# Patient Record
Sex: Female | Born: 1935 | Race: White | Hispanic: No | Smoking: Former smoker
Health system: Southern US, Community
[De-identification: ages and names within clinical notes are randomized; demographics above are authoritative.]

## PROBLEM LIST (undated history)

## (undated) DIAGNOSIS — K5909 Other constipation: Secondary | ICD-10-CM

## (undated) DIAGNOSIS — E785 Hyperlipidemia, unspecified: Secondary | ICD-10-CM

## (undated) DIAGNOSIS — G459 Transient cerebral ischemic attack, unspecified: Secondary | ICD-10-CM

## (undated) DIAGNOSIS — I420 Dilated cardiomyopathy: Secondary | ICD-10-CM

## (undated) DIAGNOSIS — Z9289 Personal history of other medical treatment: Secondary | ICD-10-CM

## (undated) DIAGNOSIS — N19 Unspecified kidney failure: Secondary | ICD-10-CM

## (undated) DIAGNOSIS — S0512XA Contusion of eyeball and orbital tissues, left eye, initial encounter: Secondary | ICD-10-CM

## (undated) DIAGNOSIS — I10 Essential (primary) hypertension: Secondary | ICD-10-CM

## (undated) DIAGNOSIS — I129 Hypertensive chronic kidney disease with stage 1 through stage 4 chronic kidney disease, or unspecified chronic kidney disease: Secondary | ICD-10-CM

## (undated) DIAGNOSIS — I440 Atrioventricular block, first degree: Secondary | ICD-10-CM

## (undated) DIAGNOSIS — N189 Chronic kidney disease, unspecified: Secondary | ICD-10-CM

## (undated) DIAGNOSIS — K625 Hemorrhage of anus and rectum: Secondary | ICD-10-CM

## (undated) DIAGNOSIS — I493 Ventricular premature depolarization: Secondary | ICD-10-CM

## (undated) DIAGNOSIS — M1990 Unspecified osteoarthritis, unspecified site: Secondary | ICD-10-CM

## (undated) DIAGNOSIS — T8859XA Other complications of anesthesia, initial encounter: Secondary | ICD-10-CM

## (undated) DIAGNOSIS — R29898 Other symptoms and signs involving the musculoskeletal system: Secondary | ICD-10-CM

## (undated) DIAGNOSIS — M169 Osteoarthritis of hip, unspecified: Secondary | ICD-10-CM

## (undated) DIAGNOSIS — T4145XA Adverse effect of unspecified anesthetic, initial encounter: Secondary | ICD-10-CM

## (undated) DIAGNOSIS — G629 Polyneuropathy, unspecified: Secondary | ICD-10-CM

## (undated) DIAGNOSIS — I447 Left bundle-branch block, unspecified: Secondary | ICD-10-CM

## (undated) DIAGNOSIS — R55 Syncope and collapse: Secondary | ICD-10-CM

## (undated) HISTORY — DX: Personal history of other medical treatment: Z92.89

## (undated) HISTORY — DX: Unspecified osteoarthritis, unspecified site: M19.90

## (undated) HISTORY — PX: ABDOMINAL HYSTERECTOMY: SHX81

## (undated) HISTORY — DX: Hemorrhage of anus and rectum: K62.5

## (undated) HISTORY — DX: Dilated cardiomyopathy: I42.0

## (undated) HISTORY — DX: Hyperlipidemia, unspecified: E78.5

## (undated) HISTORY — DX: Ventricular premature depolarization: I49.3

## (undated) HISTORY — PX: KNEE SURGERY: SHX244

## (undated) HISTORY — DX: Atrioventricular block, first degree: I44.0

## (undated) HISTORY — DX: Syncope and collapse: R55

## (undated) HISTORY — DX: Osteoarthritis of hip, unspecified: M16.9

## (undated) HISTORY — PX: BLADDER SURGERY: SHX569

## (undated) HISTORY — DX: Other symptoms and signs involving the musculoskeletal system: R29.898

## (undated) HISTORY — PX: DILATION AND CURETTAGE OF UTERUS: SHX78

## (undated) HISTORY — DX: Contusion of eyeball and orbital tissues, left eye, initial encounter: S05.12XA

## (undated) HISTORY — DX: Hypertensive chronic kidney disease with stage 1 through stage 4 chronic kidney disease, or unspecified chronic kidney disease: I12.9

## (undated) HISTORY — DX: Polyneuropathy, unspecified: G62.9

## (undated) HISTORY — PX: BACK SURGERY: SHX140

## (undated) HISTORY — PX: TONSILLECTOMY: SUR1361

---

## 1998-02-05 ENCOUNTER — Encounter (HOSPITAL_COMMUNITY): Admission: RE | Admit: 1998-02-05 | Discharge: 1998-05-06 | Payer: Self-pay | Admitting: Family Medicine

## 1998-04-15 ENCOUNTER — Other Ambulatory Visit: Admission: RE | Admit: 1998-04-15 | Discharge: 1998-04-15 | Payer: Self-pay | Admitting: *Deleted

## 1998-05-20 ENCOUNTER — Encounter (HOSPITAL_COMMUNITY): Admission: RE | Admit: 1998-05-20 | Discharge: 1998-08-04 | Payer: Self-pay | Admitting: Psychiatry

## 1998-08-04 ENCOUNTER — Encounter (HOSPITAL_COMMUNITY): Admission: RE | Admit: 1998-08-04 | Discharge: 1998-11-02 | Payer: Self-pay | Admitting: Psychiatry

## 1999-03-08 ENCOUNTER — Emergency Department (HOSPITAL_COMMUNITY): Admission: EM | Admit: 1999-03-08 | Discharge: 1999-03-08 | Payer: Self-pay | Admitting: Emergency Medicine

## 1999-09-20 ENCOUNTER — Other Ambulatory Visit: Admission: RE | Admit: 1999-09-20 | Discharge: 1999-09-20 | Payer: Self-pay | Admitting: *Deleted

## 1999-12-12 ENCOUNTER — Encounter: Admission: RE | Admit: 1999-12-12 | Discharge: 2000-03-11 | Payer: Self-pay | Admitting: Anesthesiology

## 2000-03-19 ENCOUNTER — Encounter: Admission: RE | Admit: 2000-03-19 | Discharge: 2000-06-17 | Payer: Self-pay | Admitting: Anesthesiology

## 2000-10-19 ENCOUNTER — Other Ambulatory Visit: Admission: RE | Admit: 2000-10-19 | Discharge: 2000-10-19 | Payer: Self-pay | Admitting: *Deleted

## 2001-03-24 ENCOUNTER — Inpatient Hospital Stay (HOSPITAL_COMMUNITY): Admission: EM | Admit: 2001-03-24 | Discharge: 2001-03-26 | Payer: Self-pay | Admitting: Emergency Medicine

## 2001-03-26 DIAGNOSIS — R55 Syncope and collapse: Secondary | ICD-10-CM

## 2001-03-26 HISTORY — DX: Syncope and collapse: R55

## 2002-02-17 ENCOUNTER — Emergency Department (HOSPITAL_COMMUNITY): Admission: EM | Admit: 2002-02-17 | Discharge: 2002-02-17 | Payer: Self-pay | Admitting: Emergency Medicine

## 2002-02-17 ENCOUNTER — Encounter: Payer: Self-pay | Admitting: Emergency Medicine

## 2002-05-29 ENCOUNTER — Encounter: Payer: Self-pay | Admitting: Orthopedic Surgery

## 2002-06-05 ENCOUNTER — Observation Stay (HOSPITAL_COMMUNITY): Admission: RE | Admit: 2002-06-05 | Discharge: 2002-06-06 | Payer: Self-pay | Admitting: Orthopedic Surgery

## 2002-06-05 HISTORY — PX: SHOULDER SURGERY: SHX246

## 2003-02-17 ENCOUNTER — Ambulatory Visit (HOSPITAL_COMMUNITY): Admission: RE | Admit: 2003-02-17 | Discharge: 2003-02-17 | Payer: Self-pay | Admitting: *Deleted

## 2003-02-17 DIAGNOSIS — K625 Hemorrhage of anus and rectum: Secondary | ICD-10-CM

## 2003-02-17 HISTORY — DX: Hemorrhage of anus and rectum: K62.5

## 2003-03-10 ENCOUNTER — Encounter: Admission: RE | Admit: 2003-03-10 | Discharge: 2003-03-10 | Payer: Self-pay | Admitting: Internal Medicine

## 2003-06-02 ENCOUNTER — Emergency Department (HOSPITAL_COMMUNITY): Admission: EM | Admit: 2003-06-02 | Discharge: 2003-06-03 | Payer: Self-pay | Admitting: Emergency Medicine

## 2003-06-02 ENCOUNTER — Encounter: Payer: Self-pay | Admitting: Emergency Medicine

## 2003-06-28 ENCOUNTER — Ambulatory Visit (HOSPITAL_COMMUNITY): Admission: RE | Admit: 2003-06-28 | Discharge: 2003-06-28 | Payer: Self-pay | Admitting: Neurology

## 2004-12-05 ENCOUNTER — Encounter: Admission: RE | Admit: 2004-12-05 | Discharge: 2004-12-05 | Payer: Self-pay | Admitting: Internal Medicine

## 2005-06-13 ENCOUNTER — Encounter: Admission: RE | Admit: 2005-06-13 | Discharge: 2005-06-13 | Payer: Self-pay | Admitting: Internal Medicine

## 2006-08-05 ENCOUNTER — Emergency Department (HOSPITAL_COMMUNITY): Admission: EM | Admit: 2006-08-05 | Discharge: 2006-08-05 | Payer: Self-pay | Admitting: Emergency Medicine

## 2007-03-20 ENCOUNTER — Encounter: Admission: RE | Admit: 2007-03-20 | Discharge: 2007-03-20 | Payer: Self-pay | Admitting: Internal Medicine

## 2007-10-30 ENCOUNTER — Encounter: Admission: RE | Admit: 2007-10-30 | Discharge: 2007-10-30 | Payer: Self-pay | Admitting: Internal Medicine

## 2009-03-27 ENCOUNTER — Encounter: Admission: RE | Admit: 2009-03-27 | Discharge: 2009-03-27 | Payer: Self-pay | Admitting: Otolaryngology

## 2009-06-23 ENCOUNTER — Inpatient Hospital Stay (HOSPITAL_COMMUNITY): Admission: RE | Admit: 2009-06-23 | Discharge: 2009-06-26 | Payer: Self-pay | Admitting: Specialist

## 2009-08-02 ENCOUNTER — Inpatient Hospital Stay (HOSPITAL_COMMUNITY): Admission: EM | Admit: 2009-08-02 | Discharge: 2009-08-04 | Payer: Self-pay | Admitting: Emergency Medicine

## 2010-03-16 ENCOUNTER — Encounter: Admission: RE | Admit: 2010-03-16 | Discharge: 2010-03-16 | Payer: Self-pay | Admitting: Neurology

## 2010-05-04 ENCOUNTER — Encounter: Admission: RE | Admit: 2010-05-04 | Discharge: 2010-05-04 | Payer: Self-pay | Admitting: Internal Medicine

## 2010-05-15 ENCOUNTER — Emergency Department (HOSPITAL_COMMUNITY): Admission: EM | Admit: 2010-05-15 | Discharge: 2010-05-15 | Payer: Self-pay | Admitting: Emergency Medicine

## 2010-09-18 ENCOUNTER — Encounter: Payer: Self-pay | Admitting: Orthopedic Surgery

## 2010-09-18 ENCOUNTER — Encounter: Payer: Self-pay | Admitting: Specialist

## 2010-11-10 LAB — DIFFERENTIAL
Basophils Absolute: 0.1 10*3/uL (ref 0.0–0.1)
Basophils Relative: 1 % (ref 0–1)
Eosinophils Absolute: 0.3 10*3/uL (ref 0.0–0.7)
Eosinophils Relative: 4 % (ref 0–5)
Lymphocytes Relative: 23 % (ref 12–46)
Lymphs Abs: 1.5 10*3/uL (ref 0.7–4.0)
Monocytes Absolute: 0.5 10*3/uL (ref 0.1–1.0)
Monocytes Relative: 7 % (ref 3–12)
Neutro Abs: 4.3 10*3/uL (ref 1.7–7.7)
Neutrophils Relative %: 65 % (ref 43–77)

## 2010-11-10 LAB — CBC
HCT: 38.6 % (ref 36.0–46.0)
Hemoglobin: 13.1 g/dL (ref 12.0–15.0)
MCH: 31.6 pg (ref 26.0–34.0)
MCHC: 33.9 g/dL (ref 30.0–36.0)
MCV: 93 fL (ref 78.0–100.0)
Platelets: 250 10*3/uL (ref 150–400)
RBC: 4.15 MIL/uL (ref 3.87–5.11)
RDW: 13.6 % (ref 11.5–15.5)
WBC: 6.6 10*3/uL (ref 4.0–10.5)

## 2010-11-10 LAB — BASIC METABOLIC PANEL
BUN: 24 mg/dL — ABNORMAL HIGH (ref 6–23)
CO2: 25 mEq/L (ref 19–32)
Calcium: 10.1 mg/dL (ref 8.4–10.5)
Chloride: 107 mEq/L (ref 96–112)
Creatinine, Ser: 1.56 mg/dL — ABNORMAL HIGH (ref 0.4–1.2)
GFR calc Af Amer: 38 mL/min — ABNORMAL LOW (ref >60)
GFR calc non Af Amer: 32 mL/min — ABNORMAL LOW (ref >60)
Glucose, Bld: 94 mg/dL (ref 70–99)
Potassium: 4.4 mEq/L (ref 3.5–5.1)
Sodium: 140 mEq/L (ref 135–145)

## 2010-11-10 LAB — URINE MICROSCOPIC-ADD ON

## 2010-11-10 LAB — URINALYSIS, ROUTINE W REFLEX MICROSCOPIC
Glucose, UA: NEGATIVE mg/dL
Nitrite: NEGATIVE
Specific Gravity, Urine: 1.009 (ref 1.005–1.030)
pH: 5.5 (ref 5.0–8.0)

## 2010-11-10 LAB — ETHANOL: Alcohol, Ethyl (B): <5 mg/dL (ref 0–10)

## 2010-11-10 LAB — BRAIN NATRIURETIC PEPTIDE: Pro B Natriuretic peptide (BNP): 118 pg/mL — ABNORMAL HIGH (ref 0.0–100.0)

## 2010-11-14 IMAGING — CR DG CHEST 2V
2 series · 2 of 2 positions shown · non-contrast
Comparison: 08/05/2006

CLINICAL DATA: Preop stenosis L5-S1.

CHEST - 2 VIEW

[w chest pa]
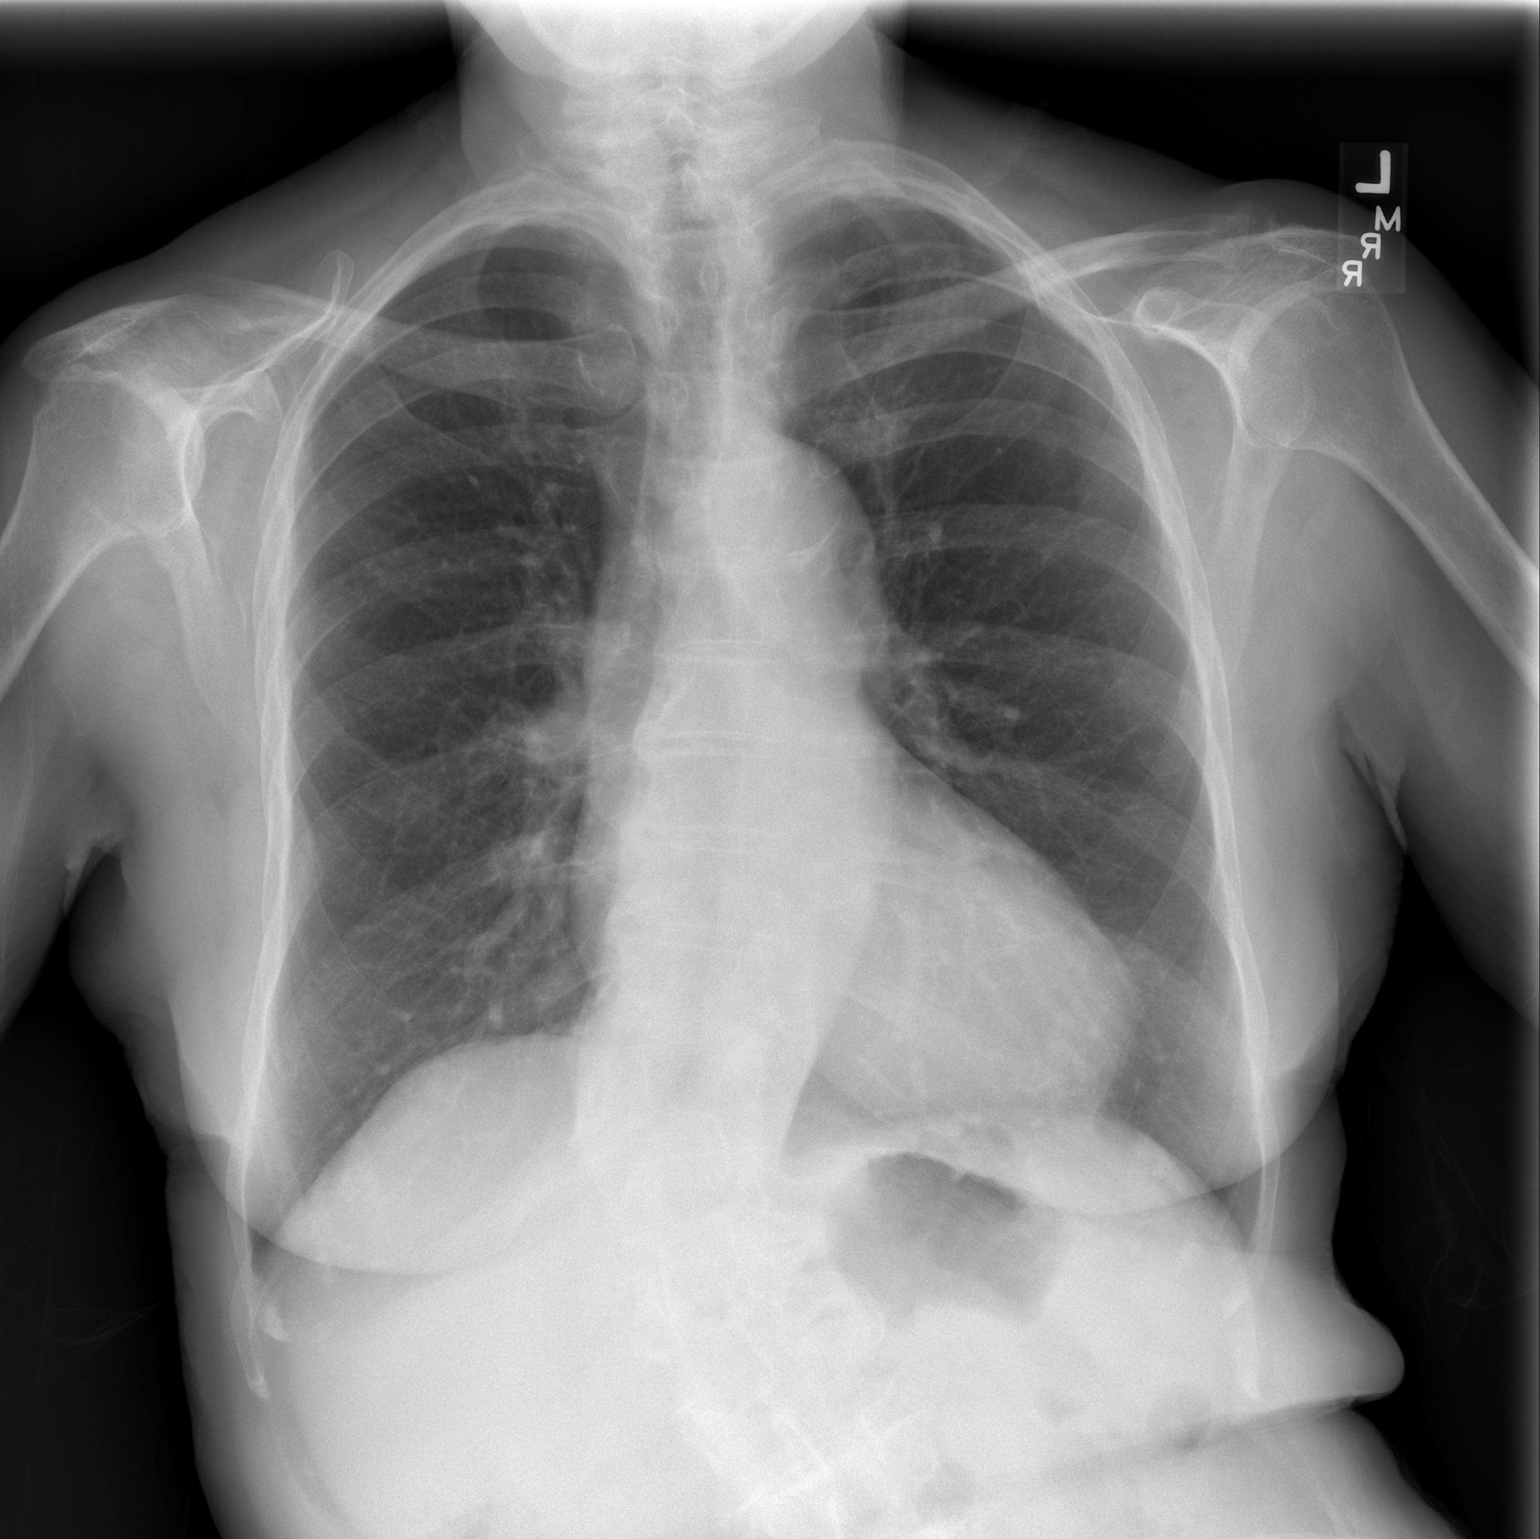

[w chest lat]
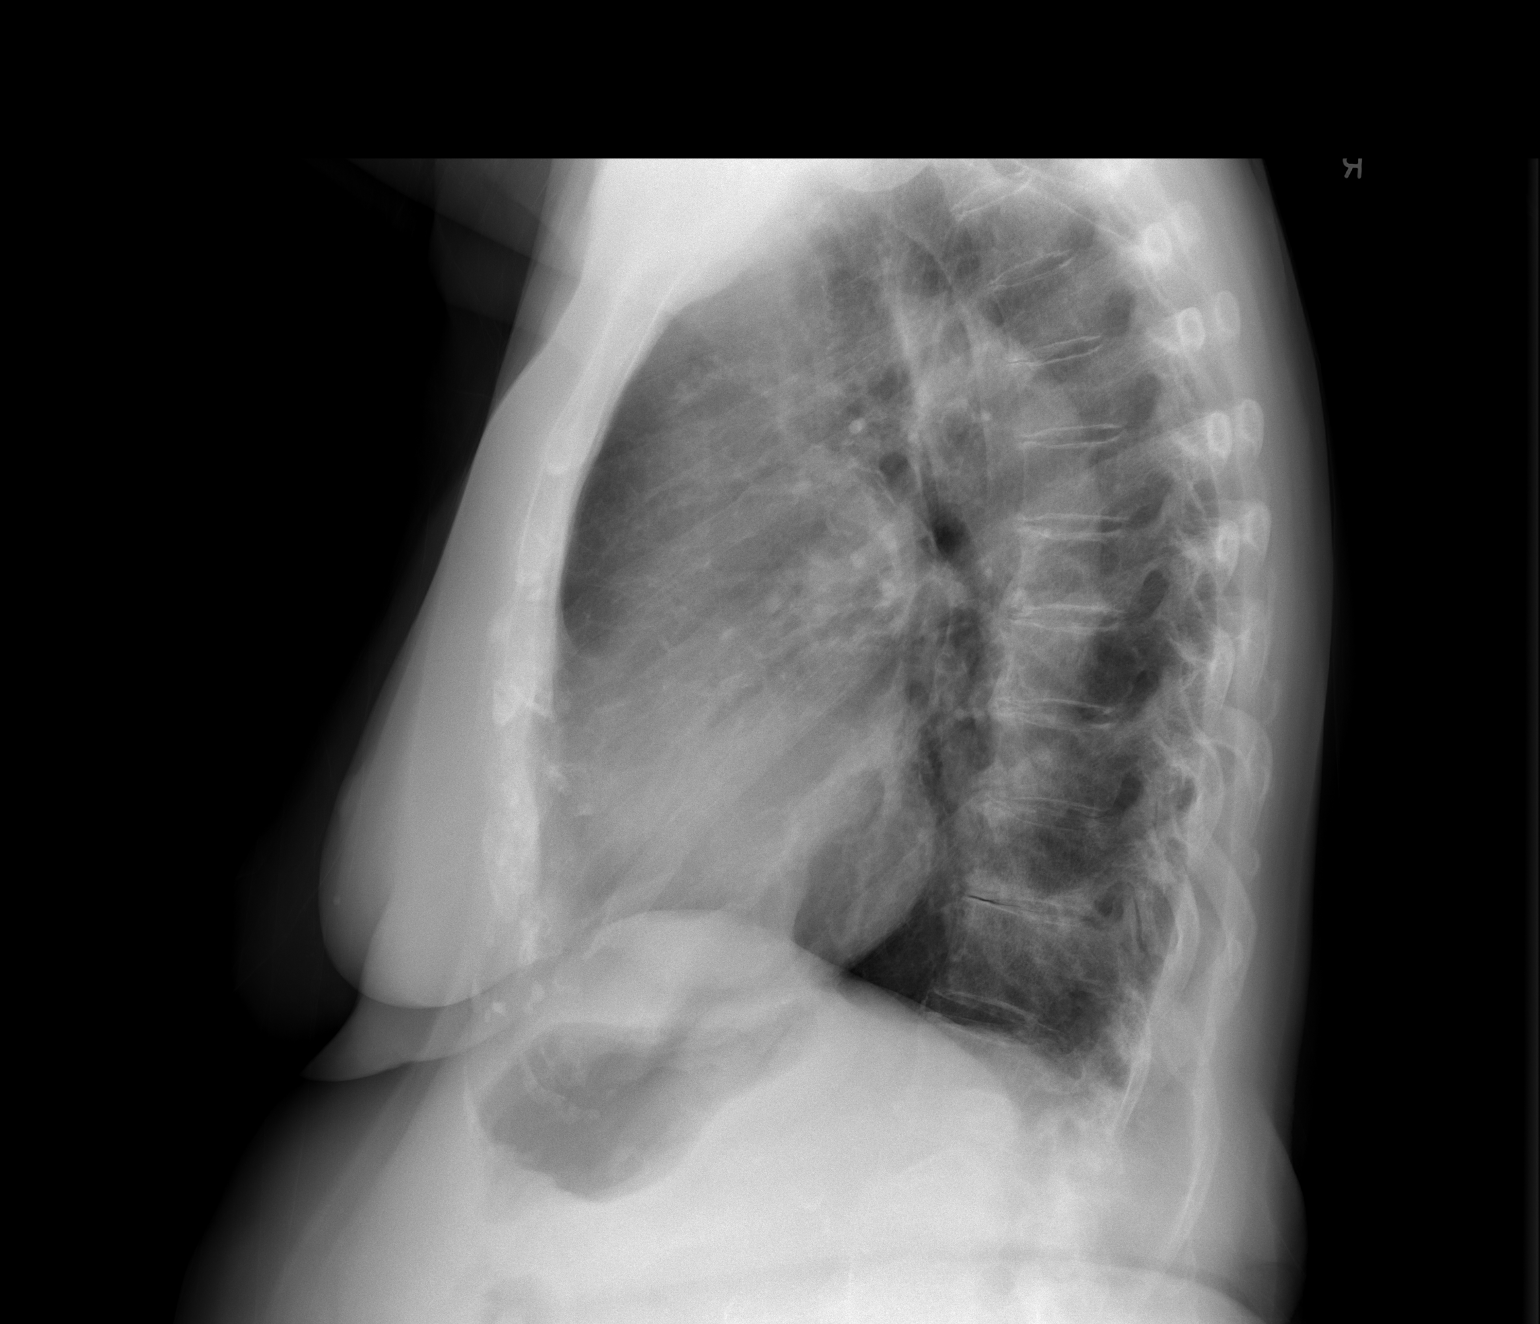

[2 of 2 positions shown; findings below may reference images not displayed]

FINDINGS: Cardiac size upper normal.  Lungs are clear of an active
process.  Spondylosis of the dorsal spine.  Lower thoracic
dextroscoliosis.
IMPRESSION: No acute chest findings.

## 2010-11-29 LAB — POCT I-STAT, CHEM 8
BUN: 42 mg/dL — ABNORMAL HIGH (ref 6–23)
Calcium, Ion: 1.25 mmol/L (ref 1.12–1.32)
Chloride: 109 mEq/L (ref 96–112)
Creatinine, Ser: 2.2 mg/dL — ABNORMAL HIGH (ref 0.4–1.2)
Glucose, Bld: 104 mg/dL — ABNORMAL HIGH (ref 70–99)
HCT: 35 % — ABNORMAL LOW (ref 36.0–46.0)
Hemoglobin: 11.9 g/dL — ABNORMAL LOW (ref 12.0–15.0)
Potassium: 4.3 meq/L (ref 3.5–5.1)
Sodium: 140 meq/L (ref 135–145)
TCO2: 24 mmol/L (ref 0–100)

## 2010-11-29 LAB — CBC
HCT: 34.4 % — ABNORMAL LOW (ref 36.0–46.0)
Hemoglobin: 11.4 g/dL — ABNORMAL LOW (ref 12.0–15.0)
MCHC: 33.2 g/dL (ref 30.0–36.0)
MCHC: 33.5 g/dL (ref 30.0–36.0)
MCV: 94.3 fL (ref 78.0–100.0)
MCV: 94.6 fL (ref 78.0–100.0)
Platelets: 246 K/uL (ref 150–400)
Platelets: 270 10*3/uL (ref 150–400)
RBC: 3.63 MIL/uL — ABNORMAL LOW (ref 3.87–5.11)
RDW: 13.4 % (ref 11.5–15.5)
RDW: 14 % (ref 11.5–15.5)
WBC: 8.7 K/uL (ref 4.0–10.5)

## 2010-11-29 LAB — DIFFERENTIAL
Basophils Absolute: 0 10*3/uL (ref 0.0–0.1)
Basophils Relative: 0 % (ref 0–1)
Eosinophils Absolute: 0.2 K/uL (ref 0.0–0.7)
Eosinophils Relative: 3 % (ref 0–5)
Lymphocytes Relative: 18 % (ref 12–46)
Lymphs Abs: 1.6 K/uL (ref 0.7–4.0)
Monocytes Absolute: 0.7 10*3/uL (ref 0.1–1.0)
Monocytes Relative: 8 % (ref 3–12)
Neutro Abs: 6.2 10*3/uL (ref 1.7–7.7)
Neutrophils Relative %: 72 % (ref 43–77)

## 2010-11-29 LAB — BASIC METABOLIC PANEL
BUN: 32 mg/dL — ABNORMAL HIGH (ref 6–23)
CO2: 23 mEq/L (ref 19–32)
Calcium: 9.5 mg/dL (ref 8.4–10.5)
Chloride: 105 mEq/L (ref 96–112)
Creatinine, Ser: 1.59 mg/dL — ABNORMAL HIGH (ref 0.4–1.2)
GFR calc Af Amer: 38 mL/min — ABNORMAL LOW (ref >60)
Glucose, Bld: 133 mg/dL — ABNORMAL HIGH (ref 70–99)

## 2010-11-29 LAB — URIC ACID: Uric Acid, Serum: 5.4 mg/dL (ref 2.4–7.0)

## 2010-12-01 LAB — CBC
HCT: 29.9 % — ABNORMAL LOW (ref 36.0–46.0)
Hemoglobin: 10.2 g/dL — ABNORMAL LOW (ref 12.0–15.0)
Hemoglobin: 13 g/dL (ref 12.0–15.0)
Hemoglobin: 9.9 g/dL — ABNORMAL LOW (ref 12.0–15.0)
MCHC: 33.9 g/dL (ref 30.0–36.0)
MCHC: 34.4 g/dL (ref 30.0–36.0)
MCV: 94.1 fL (ref 78.0–100.0)
MCV: 94.9 fL (ref 78.0–100.0)
MCV: 95.8 fL (ref 78.0–100.0)
RBC: 3.04 MIL/uL — ABNORMAL LOW (ref 3.87–5.11)
RBC: 3.1 MIL/uL — ABNORMAL LOW (ref 3.87–5.11)
RBC: 3.18 MIL/uL — ABNORMAL LOW (ref 3.87–5.11)
RBC: 4.04 MIL/uL (ref 3.87–5.11)
RDW: 13.2 % (ref 11.5–15.5)
WBC: 5.6 10*3/uL (ref 4.0–10.5)
WBC: 5.7 10*3/uL (ref 4.0–10.5)

## 2010-12-01 LAB — COMPREHENSIVE METABOLIC PANEL
ALT: 16 U/L (ref 0–35)
AST: 23 U/L (ref 0–37)
CO2: 30 mEq/L (ref 19–32)
Chloride: 108 mEq/L (ref 96–112)
Creatinine, Ser: 1.64 mg/dL — ABNORMAL HIGH (ref 0.4–1.2)
GFR calc Af Amer: 36 mL/min — ABNORMAL LOW (ref >60)
GFR calc non Af Amer: 30 mL/min — ABNORMAL LOW (ref >60)
Glucose, Bld: 78 mg/dL (ref 70–99)
Total Bilirubin: 0.5 mg/dL (ref 0.3–1.2)

## 2010-12-01 LAB — URINALYSIS, ROUTINE W REFLEX MICROSCOPIC
Bilirubin Urine: NEGATIVE
Bilirubin Urine: NEGATIVE
Glucose, UA: NEGATIVE mg/dL
Ketones, ur: NEGATIVE mg/dL
Ketones, ur: NEGATIVE mg/dL
Nitrite: NEGATIVE
Protein, ur: 30 mg/dL — AB
Protein, ur: NEGATIVE mg/dL
Urobilinogen, UA: 0.2 mg/dL (ref 0.0–1.0)
pH: 5.5 (ref 5.0–8.0)
pH: 6 (ref 5.0–8.0)

## 2010-12-01 LAB — BASIC METABOLIC PANEL
Calcium: 8.3 mg/dL — ABNORMAL LOW (ref 8.4–10.5)
GFR calc Af Amer: 40 mL/min — ABNORMAL LOW (ref >60)
GFR calc non Af Amer: 33 mL/min — ABNORMAL LOW (ref >60)
Sodium: 140 mEq/L (ref 135–145)

## 2010-12-01 LAB — URINE MICROSCOPIC-ADD ON

## 2010-12-01 LAB — APTT: aPTT: 32 seconds (ref 24–37)

## 2010-12-01 LAB — URINE CULTURE
Colony Count: NO GROWTH
Culture: NO GROWTH

## 2010-12-01 LAB — PROTIME-INR: Prothrombin Time: 12.2 seconds (ref 11.6–15.2)

## 2011-01-13 NOTE — Consult Note (Signed)
[facility]  Patient:    Lynn [name], Lynn [name]                       MRN: [id number] Adm. Date:  [id number] Attending:  Thyra Breed CC:         Dr. Royal Piedra, M.D.             Leroy Sea., M.D.                          Consultation Report  HISTORY:  The patient comes in for follow-up evaluation of her chronic low back  pain on the basis of lumbar spinal stenosis, degenerative disk disease and facet joint arthritis.  Since her last evaluation, she has done well on the increased  dose of OxyContin at three tablets per day.  She rates her pain currently at 2/10. She does continue to have a fairly significant amount f stiffness in the morning lasting 30-45 minutes.  She has missed a dose of Celebrex one time, and notes that this significantly increased her stiffness.  She seems to be tolerating the OxyContin well except for some mild sleepiness that is improving overall.  CURRENT MEDICATIONS:  Neurontin 800 mg t.i.d.  Premarin daily.  OxyContin 10 mg  t.i.d.  Triamterene q.d.  Celebrex 200 mg q.d.  Synthroid 75 mcg q.d.  Coumadin and glucosamine with chondroitin sulfate, one tablet q.d.  PHYSICAL EXAMINATION:  VITAL SIGNS:  Blood pressure 129/71.  Heart rate 67.  Respiratory rate 14.  O2 saturations 97%.  Pain level 2/10.  BACK:  The patient demonstrates no change in the degree of scoliosis of her back. Straight leg raise signs are negative.  Deep tendon reflexes are symmetric.  EXTREMITIES:  Her hand demonstrate bony enlargement of the first carpometacarpal joints with bony enlargement of the DIP in addition.  She also has some synovitis of the second and third MCP.  IMPRESSION: 1. Low back pain on the basis of scoliosis and lumbar spinal stenosis. 2. Vertebral insufficiency on Coumadin per Dr. Tomasa Rand. 3. Synovitis of the small joints of the hands with a history of gout per Dr. Phylliss Bob. 4. Hypertension per  Dr. Scotty Court. 5. Thyroid regulation per Dr. Scotty Court. 6. Osteoarthritis per Dr. Phylliss Bob.  DISPOSITION: 1. Continue on OxyContin 10 mg, one p.o. t.i.d. 2. Continue Neurontin. 3. Increase chondroitin sulfate and glucosamine. 4. Follow up with me in two months. 5. The patient was encouraged to continue follow-up with Dr. Phylliss Bob and to establish    with another one of the neurologists if Dr. Tomasa Rand is leaving, so that    someone can follow her Coumadin. 6. She is aware of my concerns with the Coumadin/Celebrex combination, but she    seems to be tolerating this well, and she is fully aware of the potential risks    of this.  However, she feels that she gets so much benefit from it as it is hat    she does not wish to stop it. DD:  01/20/00 TD:  01/20/00 Job: [id number]

## 2011-01-13 NOTE — H&P (Signed)
NAME:  Lynn [name], Lynn [name] NO.:  [id number]   MEDICAL RECORD NO.:  [id number]                    PATIENT TYPE:   LOCATION:                                       FACILITY:   PHYSICIAN:  Vania Rea. Supple, M.D.               DATE OF BIRTH:  May 18, 1925   DATE OF ADMISSION:  05/26/2002  DATE OF DISCHARGE:                                HISTORY & PHYSICAL   CHIEF COMPLAINT:  Right shoulder pain.   HISTORY OF PRESENT ILLNESS:  The patient is a very pleasant 77 year old  female who has ongoing right shoulder pain and disability for the past few  months.  Her injury happened in June when she fell sustaining an injury.  She has been followed and sent for an MRI which did demonstrate a rotator  cuff tear that showed moderate retraction.  At this time she also was noted  to have some arthropathy of the [facility] joint.  All in all, the patient has been  refractory to conservative measures and operative intervention is indicated  for these findings.  At this time the risks and benefits were discussed with  the patient and she has seen her appropriate medical physicians for  clearance in regard to stopping her Coumadin preoperatively.  At this time,  she wished to proceed after risks and benefits have been discussed at  length.   PAST MEDICAL HISTORY:  1. History of CVA and TIAs on chronic Coumadin.  2. History of peripheral neuropathy.  3. Hypothyroidism.  4. Spinal stenosis.  5. Allergic rhinitis.   PAST SURGICAL HISTORY:  1. Tonsillectomy and adenoidectomy.  2. Bladder tacking.  3. Hysterectomy.   MEDICATIONS:  1. She is currently on Neurontin which she is adjusting the dose for     peripheral neuropathy.  She can take 800 mg a day.  2. Premarin 0.5 mg 25 days of the month.  3. Triamterene/hydrochlorothiazide 75/50 mg q.d.  4. Celebrex 200 mg q.d.  5. Synthroid 75 mcg q.d.  6. Coumadin 5 mg one day of the week and then 2.5 mg six days out of the     week.  7.  Multivitamins.  8. Glucosamine chondroitin.  9. Calcium 600 mg daily.  10.      Zyrtec.  11.      Guaifenesin.   ALLERGIES:  No known drug allergies.   SOCIAL HISTORY:  She lives alone.  She has a daughter who will be planning  to stay with her postoperatively.  She does not smoke.  She uses occasional  alcohol.  Primary physician is Dr. Ricki Miller.  Her rheumatologist is Dr. Phylliss Bob.  Neurologist, she was seen by Demetrio Lapping at [facility] for  preoperative clearance and discussion of her anticoagulation issues.   REVIEW OF SYSTEMS:  The patient denies any recent fevers, chills, night  sweats, no bleeding tendencies.  CNS:  No  blurry or double vision, seizures,  headache or paralysis.  RESPIRATORY:  No shortness of breath, productive  cough or hemoptysis.  CARDIOVASCULAR:  No chest pain, angina or orthopnea.  GI:  No nausea or vomiting, constipation, melena or bloody stools.  GENITOURINARY:  No dysuria or hematuria.  MUSCULOSKELETAL:  As pertinent in  present illness.   PHYSICAL EXAMINATION:  GENERAL APPEARANCE:  The patient is  well-developed  and well-nourished.  VITAL SIGNS:  Blood pressure 152/90.  HEENT:  Normocephalic.  Extraocular movements intact.  NECK:  Supple, no lymphadenopathy.  CHEST:  Clear to auscultation bilaterally, no wheezing, rhonchi or rales.  CARDIOVASCULAR:  Regular rate and rhythm.  ABDOMEN:  Soft and nontender.  EXTREMITIES:  She has significant weakness on right rotator cuff testing.  Positive impingement signs and pain at the [facility] joint.  NEURO:  She has intact upper extremities, no pitting edema noted in the  lower extremities.   IMPRESSION:  1. Right rotator cuff tear, acromioclavicular arthropathy.  2. History of cerebrovascular accident and transient ischemic attack.  3. Peripheral neuropathy.  4. Hypothyroidism.  5. Spinal stenosis.   PLAN:  At this time we have discussed at length her surgical procedure.  I  have discussed also her case  with Demetrio Lapping, the P.A., at [facility] who has provided clearance and they recommend bridging after  stopping her Coumadin five days with Lovenox.  We have arranged this through  her Coumadin clinic with Bertram Savin, the Pharm D at [facility].  Will have repeat protime and INR done the day of admission.      Tracy A. Shuford, P.A.-C.                 Vania Rea. Supple, M.D.    TAS/MEDQ  D:  05/29/2002  T:  05/29/2002  Job:  [id number]   cc:   Areatha Keas, M.D.  [address]  [address]  [place]  [place] [address]  Fax: [phone]   Juline Patch, M.D.  [address]  [place], [place] [address]  Fax: [phone]   Neurologist Dr. Elige Radon

## 2011-01-13 NOTE — Op Note (Signed)
NAME:  Lynn [name], Lynn [name]                          ACCOUNT NO.:  [id number]   MEDICAL RECORD NO.:  [id number]                   PATIENT TYPE:  AMB   LOCATION:  DAY                                  FACILITY:  [facility]   PHYSICIAN:  Vania Rea. Supple, M.D.               DATE OF BIRTH:  11-Feb-1925   DATE OF PROCEDURE:  06/05/2002  DATE OF DISCHARGE:                                 OPERATIVE REPORT   PREOPERATIVE DIAGNOSES:  1. Right shoulder rotator cuff tear.  2. Right shoulder acromioclavicular joint arthrosis.   POSTOPERATIVE DIAGNOSES:  1. Right shoulder rotator cuff tear.  2. Right shoulder acromioclavicular joint arthrosis.   PROCEDURE:  1. Open right shoulder rotator cuff repair.  2. Subacromial decompression.  3. Distal clavicle resection.   SURGEON OF RECORD:  Vania Rea. Supple, M.D.   Threasa HeadsFrench Ana A. Shuford, P.A.-C.   ANESTHESIA:  General endotracheal.   ESTIMATED BLOOD LOSS:  200 cc.   DRAINS:  None.   HISTORY:  Lynn [name] is a 77 year old female, who has had persistent  difficulties with right shoulder pain, weakness, and limitations of motion  after a fall.  Her examination shows global weakness to testing of the  rotator cuff with an MRI scan confirming a large retracted tear of the  rotator cuff.  Due to her persistent pain and functional limitations, she is  brought to the operating room at this time for planned right shoulder  rotator cuff repair.   Preoperatively, Lynn [name] was counseled on treatment options as well as  risks versus benefits thereof.  Possible surgical complications of bleeding,  infection, neurovascular injury, persistence of pain, loss of motion,  recurrence of rotator cuff tear, and possible need for additional surgery  were all reviewed.  She understands and accepts and agrees with out planned  procedure.   DESCRIPTION OF PROCEDURE:  After undergoing routine preoperative evaluation,  the patient received prophylactic antibiotics and  was placed supine on the  operative table where she underwent smooth induction of general endotracheal  anesthesia.  She was placed in the beach chair position and appropriate  padded and protected.  The right shoulder girdle region was sterilely  prepped and draped in the standard fashion.  We made an anterolateral  incision, extending from the [facility] joint laterally and distally for a total  length of approximately 5 cm.  We did infiltrate the pro skin incision with  Marcaine with epinephrine at the beginning of the case.  Sharp dissection  was carried down through the skin and subcu tissue to the deep fascia, and  the deltoid was then split at the junction between the anterior and mid  thirds with electrocautery used for hemostasis.  Subperiosteal dissection  was then used to expose the anterior acromion and the distal clavicle.  An  oscillating saw was then introduced and used to perform a distal clavicle  resection.  We then exposed the subacromial space and utilizing the  oscillating saw, performed a subacromial decompression, creating a type 1  morphology.  We found medially a very large rotator cuff tear, measuring  approximately 4 cm in width with retraction almost back to the glenoid.  There was a large amount of bursal tissue which had scarred down in the  subacromial space, and this was sequentially divided circumferentially using  curved Mayo scissors.  The biceps tendon was inspected and found to be  intact and was in normal position in the bicipital groove.  We irrigated the  glenohumeral joint as well and saw no obvious intraarticular pathology.  We  utilized, at this point, a series of #2 Fibrewire sutures to place grasping  sutures into the free margin of the rotator cuff.  We then mobilized the  rotator cuff on its superior and inferior aspects, utilizing Mayo scissors.  Good mobility and good elasticity of the rotator cuff musculature was  achieved.  We then utilized a  curved osteotome to create a bony cancellous  trough on the greater tuberosity adjacent to the osteoarticular junction.  A  tenaculum was then used to make a series of tunnels through the greater  tuberosity, and then the free limbs of the suture was then passed through  the bone tunnels.  The rotator cuff was then repaired utilizing the sutures  through the bone tunnels and allowing excellent reapposition of the free  margin of the rotator cuff down to the bony cancellous bed on the greater  tuberosity.  The arm was then taken through a range of motion and showed no  obvious excessive tension with the arm at the sides.  Completed the  irrigation of the subacromial space, and final hemostasis was achieved.  We  repaired the deltoid split utilizing figure-of-eight #1 Vicryl sutures.  Then 2-0 Vicryl used to repair the subcu layer and intracuticular 3-0  Monocryl to the skin.  Steri-Strips were applied followed by a dry dressing.  Additional local anesthetic was placed along the suture line at the end of  the case.  The patient was then placed supine, extubated, and taken to the  recovery room in stable condition.                                               Vania Rea. Supple, M.D.    KMS/MEDQ  D:  06/05/2002  T:  06/05/2002  Job:  [id number]
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
## 2011-01-13 NOTE — Op Note (Signed)
   NAME:  Lynn [name], Lynn [name]                          ACCOUNT NO.:  [id number]   MEDICAL RECORD NO.:  [id number]                   PATIENT TYPE:  AMB   LOCATION:  ENDO                                 FACILITY:  [facility]   PHYSICIAN:  Georgiana Spinner, M.D.                 DATE OF BIRTH:  05-04-1925   DATE OF PROCEDURE:  02/17/2003  DATE OF DISCHARGE:                                 OPERATIVE REPORT   PROCEDURE:  Colonoscopy.   ENDOSCOPIST:  Georgiana Spinner, M.D.   INDICATIONS:  Rectal bleeding.   ANESTHESIA:  Demerol 60 mg, Versed 7 mg.   DESCRIPTION OF PROCEDURE:  With the patient mildly sedated in the left  lateral decubitus position, the Olympus videoscopic colonoscope was inserted  in the rectum  after normal rectal exam revealed hemorrhoids externally,  passed under direct vision to the cecum, identified by ileocecal valve and  appendiceal orifice, both of which were photographed.  From this point, the  colonoscope was slowly withdrawn taken circumferential views of the entire  colonic mucosa stopping only down in the rectum which appeared normal on  direct view and showed hemorrhoids on retroflexed view.  The endoscope was  straightened and withdrawn.  The  patient's vital signs and pulse oximeter  remained stable.  The patient tolerated the procedure well without apparent  complications.   FINDINGS:  Internal hemorrhoids, otherwise unremarkable colonoscopic  examination.   PLAN:  Repeat examination possibly in 5-10 years.                                               Georgiana Spinner, M.D.    GMO/MEDQ  D:  02/17/2003  T:  02/18/2003  Job:  [id number]
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
## 2011-02-07 ENCOUNTER — Other Ambulatory Visit: Payer: Self-pay | Admitting: Internal Medicine

## 2011-02-07 DIAGNOSIS — R29810 Facial weakness: Secondary | ICD-10-CM

## 2011-02-08 ENCOUNTER — Ambulatory Visit
Admission: RE | Admit: 2011-02-08 | Discharge: 2011-02-08 | Disposition: A | Discharge status: Home / Self Care | Payer: Medicare Other | Source: Ambulatory Visit | Attending: Internal Medicine | Admitting: Internal Medicine

## 2011-02-08 DIAGNOSIS — R29810 Facial weakness: Secondary | ICD-10-CM

## 2011-03-22 ENCOUNTER — Other Ambulatory Visit: Payer: Self-pay | Admitting: Pain Medicine

## 2011-03-22 DIAGNOSIS — M545 Low back pain: Secondary | ICD-10-CM

## 2011-03-28 ENCOUNTER — Ambulatory Visit
Admission: RE | Admit: 2011-03-28 | Discharge: 2011-03-28 | Disposition: A | Discharge status: Home / Self Care | Payer: Medicare Other | Source: Ambulatory Visit | Attending: Pain Medicine | Admitting: Pain Medicine

## 2011-03-28 ENCOUNTER — Other Ambulatory Visit: Payer: Self-pay | Admitting: Pain Medicine

## 2011-03-28 DIAGNOSIS — M545 Low back pain, unspecified: Secondary | ICD-10-CM

## 2011-05-11 DIAGNOSIS — Z9289 Personal history of other medical treatment: Secondary | ICD-10-CM

## 2011-05-11 HISTORY — DX: Personal history of other medical treatment: Z92.89

## 2012-03-06 ENCOUNTER — Encounter (HOSPITAL_COMMUNITY): Payer: Self-pay | Admitting: Emergency Medicine

## 2012-03-06 ENCOUNTER — Emergency Department (HOSPITAL_COMMUNITY): Payer: Medicare Other

## 2012-03-06 ENCOUNTER — Emergency Department (HOSPITAL_COMMUNITY)
Admission: EM | Admit: 2012-03-06 | Discharge: 2012-03-06 | Disposition: A | Discharge status: Home / Self Care | Payer: Medicare Other | Attending: Emergency Medicine | Admitting: Emergency Medicine

## 2012-03-06 DIAGNOSIS — R42 Dizziness and giddiness: Secondary | ICD-10-CM | POA: Insufficient documentation

## 2012-03-06 DIAGNOSIS — R079 Chest pain, unspecified: Secondary | ICD-10-CM | POA: Insufficient documentation

## 2012-03-06 DIAGNOSIS — R0602 Shortness of breath: Secondary | ICD-10-CM | POA: Insufficient documentation

## 2012-03-06 DIAGNOSIS — Z7982 Long term (current) use of aspirin: Secondary | ICD-10-CM | POA: Insufficient documentation

## 2012-03-06 DIAGNOSIS — I517 Cardiomegaly: Secondary | ICD-10-CM | POA: Insufficient documentation

## 2012-03-06 DIAGNOSIS — Z79899 Other long term (current) drug therapy: Secondary | ICD-10-CM | POA: Insufficient documentation

## 2012-03-06 LAB — CBC
Platelets: 257 10*3/uL (ref 150–400)
RDW: 14.1 % (ref 11.5–15.5)
WBC: 7.4 10*3/uL (ref 4.0–10.5)

## 2012-03-06 LAB — COMPREHENSIVE METABOLIC PANEL
AST: 26 U/L (ref 0–37)
Albumin: 3.8 g/dL (ref 3.5–5.2)
Alkaline Phosphatase: 62 U/L (ref 39–117)
Chloride: 104 mEq/L (ref 96–112)
Creatinine, Ser: 1.62 mg/dL — ABNORMAL HIGH (ref 0.50–1.10)
Potassium: 4 mEq/L (ref 3.5–5.1)
Sodium: 140 mEq/L (ref 135–145)
Total Bilirubin: 0.3 mg/dL (ref 0.3–1.2)

## 2012-03-06 LAB — POCT I-STAT TROPONIN I

## 2012-03-06 MED ORDER — ASPIRIN 81 MG PO CHEW
324.0000 mg | CHEWABLE_TABLET | Freq: Once | ORAL | Status: AC
  Administered 2012-03-06: 324 mg via ORAL
  Filled 2012-03-06: qty 4
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
## 2012-03-06 NOTE — ED Provider Notes (Addendum)
78 year old female had an episode on at about noon of of the vague sense of not feeling well in her chest and associated dizziness which was near syncopal. She felt mildly dyspneic. There was no associated chest pain, heaviness, tightness, pressure and there was no nausea or vomiting. Episode resolved fairly quickly. She had 2 similar episodes about a week ago. Exam is unremarkable including no no carotid bruits and no reproduction of symptoms with vestibular stimulation. Initial cardiac markers are negative. Cardiac markers will be repeated and if negative, she will be sent home with instructions to followup with her PCP for outpatient workup.  Dione Booze, MD 03/06/12 2002   Date: 03/07/2012  Rate: 83  Rhythm: normal sinus rhythm  QRS Axis: left  Intervals: normal  ST/T Wave abnormalities: normal  Conduction Disutrbances:left bundle branch block  Narrative Interpretation: Left bundle branch block. When compared  With ECG of 05/15/2010, no significant changes are noted.  Old EKG Reviewed: unchanged    Dione Booze, MD 03/07/12 1433

## 2012-03-06 NOTE — ED Notes (Signed)
Pt reports having a "funny feeling" that started in abd and up into her chest and is followed by feeling of dizziness, started over one week ago. Denies any cp, ekg done. No acute distress noted at this time.

## 2012-03-06 NOTE — ED Notes (Signed)
Pt stated that she has been having SOB with exertion x 1 week. It has been becoming increasingly worse with walking. Pt does not wear home oxygen. Pt stated that she also has been having dizziness with standing and walking x 1 week. No LOC. No CP. Pt currently not dizzy while sitting in bed. Pt currently not having in any respiratory distress. However, pt did state that she started becoming SOB when she walked to the bathroom with her daughter. Will continue to monitor.

## 2012-03-07 ENCOUNTER — Encounter (HOSPITAL_COMMUNITY): Payer: Self-pay | Admitting: Emergency Medicine

## 2012-03-07 NOTE — ED Provider Notes (Signed)
History     CSN: [id number]  Arrival date & time 03/06/12  1221   First MD Initiated Contact with Patient 03/06/12 1740      Chief Complaint  Patient presents with  . Chest Pain  . Dizziness    (Consider location/radiation/quality/duration/timing/severity/associated sxs/prior treatment) Patient is a 78 y.o. female presenting with chest pain. The history is provided by the patient.  Chest Pain The chest pain began 6 - 12 hours ago. Duration of episode(s) is 2 hours. Chest pain occurs intermittently. The chest pain is improving. The pain is associated with exertion and breathing. The severity of the pain is mild. The quality of the pain is described as heavy. The pain does not radiate. Chest pain is worsened by exertion. Primary symptoms include shortness of breath. Pertinent negatives for primary symptoms include no fever, no fatigue, no cough, no wheezing, no palpitations, no abdominal pain, no nausea, no vomiting and no dizziness.  Pertinent negatives for associated symptoms include no diaphoresis and no numbness. She tried nothing for the symptoms. Risk factors include being elderly.  Pertinent negatives for past medical history include no CAD, no CHF, no diabetes, no DVT and no seizures.  Procedure history is negative for cardiac catheterization.     History reviewed. No pertinent past medical history.  History reviewed. No pertinent past surgical history.  History reviewed. No pertinent family history.  History  Substance Use Topics  . Smoking status: Not on file  . Smokeless tobacco: Not on file  . Alcohol Use: Not on file    OB History    Grav Para Term Preterm Abortions TAB SAB Ect Mult Living                  Review of Systems  Constitutional: Negative for fever, chills, diaphoresis and fatigue.  HENT: Negative for ear pain, congestion, sore throat, facial swelling, mouth sores, trouble swallowing, neck pain and neck stiffness.   Eyes: Negative.   Respiratory:  Positive for shortness of breath. Negative for apnea, cough, chest tightness and wheezing.   Cardiovascular: Positive for chest pain. Negative for palpitations and leg swelling.  Gastrointestinal: Negative for nausea, vomiting, abdominal pain, diarrhea and abdominal distention.  Genitourinary: Negative for hematuria, flank pain, vaginal discharge, difficulty urinating and menstrual problem.  Musculoskeletal: Negative for back pain and gait problem.  Skin: Negative for rash and wound.  Neurological: Negative for dizziness, tremors, seizures, syncope, facial asymmetry, numbness and headaches.  Psychiatric/Behavioral: Negative.   All other systems reviewed and are negative.    Allergies  Tape  Home Medications   Current Outpatient Rx  Name Route Sig Dispense Refill  . ACETAMINOPHEN 500 MG PO TABS Oral Take 500 mg by mouth every 6 (six) hours as needed.    . ALLOPURINOL 100 MG PO TABS Oral Take 100 mg by mouth daily.    . ASPIRIN 81 MG PO CHEW Oral Chew 81 mg by mouth daily.    Marland Kitchen DOCUSATE SODIUM 100 MG PO CAPS Oral Take 100 mg by mouth at bedtime.    . DONEPEZIL HCL 10 MG PO TABS Oral Take 10 mg by mouth at bedtime.    . OMEGA-3 FATTY ACIDS 1000 MG PO CAPS Oral Take 2 g by mouth daily.    Marland Kitchen LEVOTHYROXINE SODIUM 50 MCG PO TABS Oral Take 50 mcg by mouth daily.    . ADULT MULTIVITAMIN W/MINERALS CH Oral Take 1 tablet by mouth daily.    Marland Kitchen CALCIUM POLYCARBOPHIL 625 MG PO TABS Oral  Take 625 mg by mouth daily.    . TELMISARTAN 80 MG PO TABS Oral Take 80 mg by mouth daily.    Marland Kitchen ZOLPIDEM TARTRATE 5 MG PO TABS Oral Take 5 mg by mouth at bedtime as needed. For sleep      BP 137/57  Pulse 73  Temp 98.6 F (37 C) (Oral)  Resp 18  SpO2 100%  Physical Exam  Nursing note and vitals reviewed. Constitutional: She is oriented to person, place, and time. She appears well-developed and well-nourished. No distress.  HENT:  Head: Normocephalic and atraumatic.  Right Ear: External ear normal.  Left  Ear: External ear normal.  Nose: Nose normal.  Mouth/Throat: Oropharynx is clear and moist. No oropharyngeal exudate.  Eyes: Conjunctivae and EOM are normal. Pupils are equal, round, and reactive to light. Right eye exhibits no discharge. Left eye exhibits no discharge.  Neck: Normal range of motion. Neck supple. No JVD present. No tracheal deviation present. No thyromegaly present.  Cardiovascular: Normal rate, regular rhythm, normal heart sounds and intact distal pulses.  Exam reveals no gallop and no friction rub.   No murmur heard. Pulmonary/Chest: Effort normal and breath sounds normal. No respiratory distress. She has no wheezes. She has no rales. She exhibits no tenderness.  Abdominal: Soft. Bowel sounds are normal. She exhibits no distension. There is no tenderness. There is no rebound and no guarding.  Musculoskeletal: Normal range of motion.  Lymphadenopathy:    She has no cervical adenopathy.  Neurological: She is alert and oriented to person, place, and time. No cranial nerve deficit. Coordination normal.  Skin: Skin is warm. No rash noted. She is not diaphoretic.  Psychiatric: She has a normal mood and affect. Her behavior is normal. Judgment and thought content normal.    ED Course  Procedures (including critical care time)  Labs Reviewed  COMPREHENSIVE METABOLIC PANEL - Abnormal; Notable for the following:    Glucose, Bld 104 (*)     BUN 36 (*)     Creatinine, Ser 1.62 (*)     GFR calc non Af Amer 28 (*)     GFR calc Af Amer 32 (*)     All other components within normal limits  CBC  POCT I-STAT TROPONIN I  POCT I-STAT TROPONIN I   Dg Chest 2 View  03/06/2012  *RADIOLOGY REPORT*  Clinical Data: Short of breath.  Weakness.  CHEST - 2 VIEW  Comparison: 06/18/2009  Findings: The heart is enlarged.  The aorta is unfolded.  There may be venous hypertension but there is no frank edema.  No effusions. No infiltrate or collapse.  No significant bony finding.  IMPRESSION:  Cardiomegaly.  Possible venous hypertension.  No frank edema.  Original Report Authenticated By: Thomasenia Sales, M.D.     1. Shortness of breath       MDM  78 year old female patient with no past medical history of heart or lung disease presents with chest pressure sensation. Patient says sensation started today at church. Patient says it was worse with exertion and describes the sensation as a heaviness and shortness of breath. Here patient vital signs stable satting normally on room air not breathing quickly. Patient says episode has lasted a couple hours but is resolving. Patient says she had such an episode a week ago that was similar that resolved on its own. Patient denies it feels like pain in one heaviness sensation in the mid chest nonradiating worse with exertion and nothing makes it better  besides rest. Patient with no abdominal pain nausea vomiting normal exam otherwise. Patient with low risk for ACS and description of the pain. Patient also without coronary artery disease risk factors. Will screen for it with serial troponins. Patient states that she can followup with her PCP for further risk stratification with stress testing. Patient well's score 0. Patient now feeling asymptomatic here. Workup is normal as below. No evidence of pneumonia pneumothorax pericarditis doubt pulmonary embolism given low well score no chest pain component. We'll have patient followup with PCP for further stress testing should she require it patient now asymptomatic.  Results for orders placed during the hospital encounter of 03/06/12  CBC      Component Value Range   WBC 7.4  4.0 - 10.5 K/uL   RBC 3.99  3.87 - 5.11 MIL/uL   Hemoglobin 12.7  12.0 - 15.0 g/dL   HCT 16.1  09.6 - 04.5 %   MCV 93.5  78.0 - 100.0 fL   MCH 31.8  26.0 - 34.0 pg   MCHC 34.0  30.0 - 36.0 g/dL   RDW 40.9  81.1 - 91.4 %   Platelets 257  150 - 400 K/uL  COMPREHENSIVE METABOLIC PANEL      Component Value Range   Sodium 140  135  - 145 mEq/L   Potassium 4.0  3.5 - 5.1 mEq/L   Chloride 104  96 - 112 mEq/L   CO2 21  19 - 32 mEq/L   Glucose, Bld 104 (*) 70 - 99 mg/dL   BUN 36 (*) 6 - 23 mg/dL   Creatinine, Ser 7.82 (*) 0.50 - 1.10 mg/dL   Calcium 95.6  8.4 - 21.3 mg/dL   Total Protein 7.3  6.0 - 8.3 g/dL   Albumin 3.8  3.5 - 5.2 g/dL   AST 26  0 - 37 U/L   ALT 16  0 - 35 U/L   Alkaline Phosphatase 62  39 - 117 U/L   Total Bilirubin 0.3  0.3 - 1.2 mg/dL   GFR calc non Af Amer 28 (*) >90 mL/min   GFR calc Af Amer 32 (*) >90 mL/min  POCT I-STAT TROPONIN I      Component Value Range   Troponin i, poc 0.02  0.00 - 0.08 ng/mL   Comment 3           POCT I-STAT TROPONIN I      Component Value Range   Troponin i, poc 0.03  0.00 - 0.08 ng/mL   Comment 3            DG Chest 2 View (Final result)   Result time:03/06/12 1951    Final result by Rad Results In Interface (03/06/12 19:51:18)    Narrative:   *RADIOLOGY REPORT*  Clinical Data: Short of breath. Weakness.  CHEST - 2 VIEW  Comparison: 06/18/2009  Findings: The heart is enlarged. The aorta is unfolded. There may be venous hypertension but there is no frank edema. No effusions. No infiltrate or collapse. No significant bony finding.  IMPRESSION: Cardiomegaly. Possible venous hypertension. No frank edema.  Original Report Authenticated By: Thomasenia Sales, M.D.    Date: 03/07/2012  Rate: 83  Rhythm: normal sinus rhythm  QRS Axis: left  Intervals: normal  ST/T Wave abnormalities: normal  Conduction Disutrbances:right bundle branch block  Narrative Interpretation:   Old EKG Reviewed: unchanged    Case discussed with Dr. Blanche East, MD 03/07/12 [phone]

## 2012-03-07 NOTE — ED Provider Notes (Signed)
I saw and evaluated the patient, reviewed the resident's note and I agree with the findings and plan.   Dione Booze, MD 03/07/12 [phone]

## 2012-03-22 ENCOUNTER — Inpatient Hospital Stay (HOSPITAL_COMMUNITY)
Admission: AD | Admit: 2012-03-22 | Discharge: 2012-03-25 | DRG: 287 | Disposition: A | Discharge status: Home / Self Care | Payer: Medicare Other | Source: Ambulatory Visit | Attending: Cardiology | Admitting: Cardiology

## 2012-03-22 ENCOUNTER — Encounter (HOSPITAL_COMMUNITY): Payer: Self-pay | Admitting: Cardiology

## 2012-03-22 DIAGNOSIS — I472 Ventricular tachycardia, unspecified: Secondary | ICD-10-CM | POA: Diagnosis not present

## 2012-03-22 DIAGNOSIS — R55 Syncope and collapse: Secondary | ICD-10-CM | POA: Diagnosis present

## 2012-03-22 DIAGNOSIS — Z8673 Personal history of transient ischemic attack (TIA), and cerebral infarction without residual deficits: Secondary | ICD-10-CM

## 2012-03-22 DIAGNOSIS — I10 Essential (primary) hypertension: Secondary | ICD-10-CM | POA: Diagnosis present

## 2012-03-22 DIAGNOSIS — Z87891 Personal history of nicotine dependence: Secondary | ICD-10-CM

## 2012-03-22 DIAGNOSIS — I43 Cardiomyopathy in diseases classified elsewhere: Secondary | ICD-10-CM | POA: Diagnosis present

## 2012-03-22 DIAGNOSIS — N189 Chronic kidney disease, unspecified: Secondary | ICD-10-CM | POA: Diagnosis present

## 2012-03-22 DIAGNOSIS — I5042 Chronic combined systolic (congestive) and diastolic (congestive) heart failure: Secondary | ICD-10-CM | POA: Diagnosis present

## 2012-03-22 DIAGNOSIS — I129 Hypertensive chronic kidney disease with stage 1 through stage 4 chronic kidney disease, or unspecified chronic kidney disease: Secondary | ICD-10-CM | POA: Diagnosis present

## 2012-03-22 DIAGNOSIS — I2510 Atherosclerotic heart disease of native coronary artery without angina pectoris: Secondary | ICD-10-CM | POA: Diagnosis present

## 2012-03-22 DIAGNOSIS — I447 Left bundle-branch block, unspecified: Secondary | ICD-10-CM | POA: Diagnosis present

## 2012-03-22 DIAGNOSIS — I951 Orthostatic hypotension: Principal | ICD-10-CM | POA: Diagnosis present

## 2012-03-22 DIAGNOSIS — Z79899 Other long term (current) drug therapy: Secondary | ICD-10-CM

## 2012-03-22 DIAGNOSIS — Z7982 Long term (current) use of aspirin: Secondary | ICD-10-CM

## 2012-03-22 DIAGNOSIS — I4729 Other ventricular tachycardia: Secondary | ICD-10-CM | POA: Diagnosis not present

## 2012-03-22 DIAGNOSIS — I428 Other cardiomyopathies: Secondary | ICD-10-CM | POA: Diagnosis present

## 2012-03-22 HISTORY — DX: Transient cerebral ischemic attack, unspecified: G45.9

## 2012-03-22 HISTORY — DX: Essential (primary) hypertension: I10

## 2012-03-22 HISTORY — DX: Left bundle-branch block, unspecified: I44.7

## 2012-03-22 HISTORY — DX: Chronic kidney disease, unspecified: N18.9

## 2012-03-22 LAB — CREATININE, SERUM
GFR calc Af Amer: 35 mL/min — ABNORMAL LOW (ref >90)
GFR calc non Af Amer: 30 mL/min — ABNORMAL LOW (ref >90)

## 2012-03-22 LAB — CBC
HCT: 37.6 % (ref 36.0–46.0)
Hemoglobin: 12.6 g/dL (ref 12.0–15.0)
MCV: 94.5 fL (ref 78.0–100.0)
RBC: 3.98 MIL/uL (ref 3.87–5.11)
WBC: 6.2 10*3/uL (ref 4.0–10.5)

## 2012-03-22 MED ORDER — ACETAMINOPHEN 500 MG PO TABS
1000.0000 mg | ORAL_TABLET | Freq: Two times a day (BID) | ORAL | Status: DC
  Administered 2012-03-22 – 2012-03-24 (×5): 1000 mg via ORAL
  Filled 2012-03-22 (×7): qty 2

## 2012-03-22 MED ORDER — ADULT MULTIVITAMIN W/MINERALS CH
1.0000 | ORAL_TABLET | Freq: Every day | ORAL | Status: DC
  Administered 2012-03-23 – 2012-03-24 (×2): 1 via ORAL
  Filled 2012-03-22 (×3): qty 1

## 2012-03-22 MED ORDER — ZOLPIDEM TARTRATE 5 MG PO TABS
5.0000 mg | ORAL_TABLET | Freq: Every day | ORAL | Status: DC
  Administered 2012-03-22 – 2012-03-24 (×3): 5 mg via ORAL
  Filled 2012-03-22 (×3): qty 1

## 2012-03-22 MED ORDER — POLYETHYLENE GLYCOL 3350 17 G PO PACK
17.0000 g | PACK | Freq: Every day | ORAL | Status: DC | PRN
  Filled 2012-03-22: qty 1

## 2012-03-22 MED ORDER — ACETAMINOPHEN ER 650 MG PO TBCR: 1300.0000 mg | EXTENDED_RELEASE_TABLET | Freq: Two times a day (BID) | ORAL | Status: DC

## 2012-03-22 MED ORDER — IRBESARTAN 75 MG PO TABS
75.0000 mg | ORAL_TABLET | Freq: Every day | ORAL | Status: DC
  Administered 2012-03-22 – 2012-03-24 (×3): 75 mg via ORAL
  Filled 2012-03-22 (×4): qty 1

## 2012-03-22 MED ORDER — OMEGA-3 FATTY ACIDS 1000 MG PO CAPS: 1.0000 g | ORAL_CAPSULE | Freq: Every day | ORAL | Status: DC

## 2012-03-22 MED ORDER — ASPIRIN 81 MG PO CHEW
81.0000 mg | CHEWABLE_TABLET | Freq: Every day | ORAL | Status: DC
  Administered 2012-03-23 – 2012-03-24 (×2): 81 mg via ORAL
  Filled 2012-03-22 (×2): qty 1

## 2012-03-22 MED ORDER — ALLOPURINOL 100 MG PO TABS
100.0000 mg | ORAL_TABLET | Freq: Every day | ORAL | Status: DC
  Administered 2012-03-23 – 2012-03-24 (×2): 100 mg via ORAL
  Filled 2012-03-22 (×3): qty 1

## 2012-03-22 MED ORDER — ENOXAPARIN SODIUM 40 MG/0.4ML ~~LOC~~ SOLN
40.0000 mg | SUBCUTANEOUS | Status: DC
  Administered 2012-03-22 – 2012-03-23 (×2): 40 mg via SUBCUTANEOUS
  Filled 2012-03-22 (×3): qty 0.4

## 2012-03-22 MED ORDER — HYDROCODONE-ACETAMINOPHEN 5-325 MG PO TABS: 1.0000 | ORAL_TABLET | ORAL | Status: DC | PRN

## 2012-03-22 MED ORDER — SODIUM CHLORIDE 0.9 % IV SOLN: 250.0000 mL | INTRAVENOUS | Status: DC | PRN

## 2012-03-22 MED ORDER — CARVEDILOL 3.125 MG PO TABS
1.5625 mg | ORAL_TABLET | Freq: Two times a day (BID) | ORAL | Status: DC
  Administered 2012-03-22 – 2012-03-23 (×2): 1.5625 mg via ORAL
  Filled 2012-03-22 (×4): qty 0.5

## 2012-03-22 MED ORDER — DOCUSATE SODIUM 100 MG PO CAPS
100.0000 mg | ORAL_CAPSULE | Freq: Two times a day (BID) | ORAL | Status: DC
  Administered 2012-03-22 – 2012-03-24 (×3): 100 mg via ORAL
  Filled 2012-03-22 (×8): qty 1

## 2012-03-22 MED ORDER — DIGOXIN 125 MCG PO TABS
0.1250 mg | ORAL_TABLET | Freq: Every day | ORAL | Status: DC
  Administered 2012-03-22 – 2012-03-24 (×3): 0.125 mg via ORAL
  Filled 2012-03-22 (×4): qty 1

## 2012-03-22 MED ORDER — SODIUM CHLORIDE 0.9 % IJ SOLN
3.0000 mL | Freq: Two times a day (BID) | INTRAMUSCULAR | Status: DC
  Administered 2012-03-22 – 2012-03-24 (×5): 3 mL via INTRAVENOUS

## 2012-03-22 MED ORDER — SODIUM CHLORIDE 0.9 % IJ SOLN
3.0000 mL | Freq: Two times a day (BID) | INTRAMUSCULAR | Status: DC
  Administered 2012-03-23 – 2012-03-24 (×2): 3 mL via INTRAVENOUS

## 2012-03-22 MED ORDER — DONEPEZIL HCL 10 MG PO TABS
10.0000 mg | ORAL_TABLET | Freq: Every day | ORAL | Status: DC
  Administered 2012-03-22 – 2012-03-24 (×3): 10 mg via ORAL
  Filled 2012-03-22 (×4): qty 1

## 2012-03-22 MED ORDER — SODIUM CHLORIDE 0.9 % IJ SOLN: 3.0000 mL | INTRAMUSCULAR | Status: DC | PRN

## 2012-03-22 MED ORDER — OMEGA-3-ACID ETHYL ESTERS 1 G PO CAPS
1.0000 g | ORAL_CAPSULE | Freq: Every day | ORAL | Status: DC
  Administered 2012-03-23 – 2012-03-24 (×2): 1 g via ORAL
  Filled 2012-03-22 (×3): qty 1

## 2012-03-22 MED ORDER — DOCUSATE SODIUM 100 MG PO CAPS: 100.0000 mg | ORAL_CAPSULE | Freq: Every day | ORAL | Status: DC

## 2012-03-22 NOTE — H&P (Signed)
Lynn [name] is an 79 y.o. female.   Chief Complaint: Syncope/Near syncope and dyspnea. HPI: Lynn [name] is a 78 year old charming, fairly active Caucasian female who lives at [facility] assisted living facility who is referred to me for evaluation of syncope. Lynn [name] is very active.  Was the last 2-3 weeks she has had several episodes of near syncope in the form of dizziness.  She is felt that she will faint.  This happened without without exertional activities and positional variation was not evident.  No chest pain.  She has had some worsening shortness of breath over the last 2-3 weeks. Patient resisted this has been active and has hypertension which is well-controlled.  No other symptoms.  She denies any PND or orthopnea, she denies hemoptysis, no TIA or any neurological deficits.  Past Medical History  Diagnosis Date  . Hypertension   . Shortness of breath     History reviewed. No pertinent past surgical history.  History reviewed. No pertinent family history. Social History:  reports that she has quit smoking. She does not have any smokeless tobacco history on file. She reports that she drinks about .6 ounces of alcohol per week. She reports that she does not use illicit drugs.  Allergies:  Allergies  Allergen Reactions  . Tape Other (See Comments)    Bruising     Medications Prior to Admission  Medication Sig Dispense Refill  . acetaminophen (TYLENOL) 650 MG CR tablet Take 1,300 mg by mouth 2 (two) times daily.      Marland Kitchen allopurinol (ZYLOPRIM) 100 MG tablet Take 100 mg by mouth daily.      Marland Kitchen aspirin 81 MG chewable tablet Chew 81 mg by mouth daily.      . carvedilol (COREG) 3.125 MG tablet Take 1.5625 mg by mouth 2 (two) times daily with a meal.      . Coenzyme Q10 (COQ10) 200 MG CAPS Take 1 capsule by mouth daily.      . digoxin (LANOXIN) 0.125 MG tablet Take 0.125 mg by mouth at bedtime.      . docusate sodium (COLACE) 100 MG capsule Take 100 mg by mouth at  bedtime.      . donepezil (ARICEPT) 10 MG tablet Take 10 mg by mouth at bedtime.      . fish oil-omega-3 fatty acids 1000 MG capsule Take 1 g by mouth daily.       . Multiple Vitamin (MULTIVITAMIN WITH MINERALS) TABS Take 1 tablet by mouth daily.      Marland Kitchen olmesartan (BENICAR) 20 MG tablet Take 10 mg by mouth at bedtime.      Marland Kitchen zolpidem (AMBIEN) 5 MG tablet Take 5 mg by mouth at bedtime as needed. For sleep        ROS: Arthritis-yes-all over, uses a cane to support, life-style limiting-no;  Cramping of the legs and feet at night or with activit-yes.; Diabetes-no,;  Hypothyroidism-no;  Previous Stroke- 3 TIA's in late 80's, Previous GI Bleed-no ,  Recent weight change-no, Symptoms to suggest sleep apnea like loud snoring, daytime sleepiness-no, Other systems negative.   Blood pressure 145/78, pulse 67, temperature 98.5 F (36.9 C), temperature source Oral, resp. rate 18, height 5\' 2"  (1.575 m), weight 54.2 kg (119 lb 7.8 oz), SpO2 98.00%.  General: Moderate build and normal body habitus who is  in no acute distress. Appears younger than stated age. Alert Ox3.    There is no cyanosis.  HEENT: normal limits. PERRLA, No JVD.  CARDIAC EXAM:S1 normal S2 paradoxical split.  no gallop present. II/VI long systolic murmur in the apex noted.   CHEST EXAM: No tenderness of chest wall.  LUNGS: Clear to percuss and auscultate.   ABDOMEN: No hepatosplenomegaly.  BS normal in all 4 quadrants. Abdomen is non-tender.    EXTREMITY: Full range of movementes, No edema.  MUSCULOSKELETAL EXAM: Intact with full range of motion in all 4 extremities.    NEUROLOGIC EXAM: Grossly intact without any focal deficits. Alert O x 3.     VASCULAR EXAM: No skin breakdown. Carotids  normal. Extremities: Femoral pulse normal. Popliteal pulse normal ; Pedal pulse normal.  Otherwise No prominent pulse felt in the abdomen. No varicose veins.  Assessment/Plan  Lynn [name] is a 78 year old charming, fairly active Caucasian  female who lives at [facility] assisted living facility who is referred to me for evaluation of syncope. Lynn [name] is very active.  Was the last 2-3 weeks she has had several episodes of near syncope in the form of dizziness.  She is felt that she will faint.  This happened without without exertional activities and positional variation was not evident.  No chest pain.  She has had some worsening shortness of breath over the last 2-3 weeks. Patient resisted this has been active and has hypertension which is well-controlled.  No other symptoms.  She denies any PND or orthopnea, she denies hemoptysis, no TIA or any neurological deficits.   Impression:  1.  Syncope.  I suspect this is probably due to cardiac dysrhythmias including probable went into tachycardia.  Conduction system disease in the ultra-elderly 78 year old female cannot also be excluded. Chest xray 03/06/12: cardiomegaly, unfolded aorta. 2. Cardiomyopathy.  ECG 03/22/12: NSR @ 76/min. LAE, LAD, LBBB. No further evaluation done due to LBBB. Echo 03/11/12: Mod LV dilatation. EF 10-15%. Dilated cardiomyopathy. Mild MR. Mod LA enlaargement. 2.  Hypertension well controlled.  No orthostasis on physical examination today. 3.  Remote TIA without residual neurological deficits.  4. Chronic renal failure with S. Cr 1.6-1.9 stable since 2012. labs 03/14/12: BNP 643. S. Cr 1.9,  eGFR 32. Hb/HCT normal. ED Summary 03/06/12: Near synocpe. S. Cr 1.6 to 2.2. Mild anemia. 5. 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  CT Head 02/08/11: Right anterior thalamus, left cerebellum chronic infarcts. Chronic white matter disease.  Plan:  Mechanism of underlying disease process and action of medications discussed with the patient.   I discussed primary/secondary prevention and also dietary counceling was done.  I am extremely worried about her presentation.  Frequent episodes of dizziness, near syncope and also patient was evaluated in the emergency department  last week for similar episode of near syncope that occurred spontaneously.  Hence I suspect she is at a very high risk for sudden cardiac death given markedly abnormal echocardiogram.  I will admit the patient to the hospital today so I can further evaluate her conduction system disease and dysrhythmias.  I will set her up for a left heart catheterization on Monday morning to exclude coronary artery disease as an etiology for cardiomyopathy. I discussed with Dr. Hillis Range who was gracious enough to give recommendations on the phone.  I requested him to consult on the patient after the admission.  Further recommendations to follow.  Pamella Pert, MD 03/22/2012, 8:57 PM

## 2012-03-23 ENCOUNTER — Encounter (HOSPITAL_COMMUNITY): Payer: Self-pay | Admitting: Internal Medicine

## 2012-03-23 DIAGNOSIS — N189 Chronic kidney disease, unspecified: Secondary | ICD-10-CM

## 2012-03-23 DIAGNOSIS — I10 Essential (primary) hypertension: Secondary | ICD-10-CM

## 2012-03-23 DIAGNOSIS — I43 Cardiomyopathy in diseases classified elsewhere: Secondary | ICD-10-CM

## 2012-03-23 DIAGNOSIS — I447 Left bundle-branch block, unspecified: Secondary | ICD-10-CM

## 2012-03-23 DIAGNOSIS — I129 Hypertensive chronic kidney disease with stage 1 through stage 4 chronic kidney disease, or unspecified chronic kidney disease: Secondary | ICD-10-CM

## 2012-03-23 MED ORDER — CARVEDILOL 3.125 MG PO TABS
3.1250 mg | ORAL_TABLET | Freq: Two times a day (BID) | ORAL | Status: DC
  Administered 2012-03-23 – 2012-03-24 (×2): 3.125 mg via ORAL
  Filled 2012-03-23 (×4): qty 1

## 2012-03-23 NOTE — Progress Notes (Signed)
03/23/2012 3:23 PM Nursing note Pt. Viewed educational video [phone]. Questions and concerns addressed. Will continue to monitor.  Aleesha Ringstad, Blanchard Kelch

## 2012-03-23 NOTE — Consult Note (Signed)
ELECTROPHYSIOLOGY CONSULT NOTE     Primary Care Physician: Juline Patch, MD Referring Physician:  Dr Jacinto Halim  Admit Date: 03/22/2012  Reason for consultation:  presyncope  Lynn [name] is a 78 y.o. female with a h/o newly diagnosed cardiomyopathy (EF per Dr Jacinto Halim 10%) with NYHA Class II/III CHF symptoms who is admitted for further evaluation including cath.  She reports being in very good health until 2 months ago.  Over the past 2 months, she reports progressive SOB and fatigue with exertion.  She has also noted occasional episodes of dizziness.  Her dizziness was most profound 03/06/12 which precipitated an evaluation at [facility] ER.  She reports that she was sitting at a funeral.  She stood and developed postural dizziness with presyncope.  She denies associated tachypalpitations or chest discomfort.  She was seen at [facility] ER and her workup was uneventful except for stable creatinine of 1.6 and a LBBB. She was discharged and has since been evaluated by Dr Jacinto Halim.  Her medicines were adjusted,including addition of coreg.  An echo revealed EF 10-15%.  Since starting medical therapy, she reports feeling "much better".  She has had no further dizziness or presyncope.  She feels that her SOB is also significantly improved.  Today, she denies symptoms of palpitations, chest pain, orthopnea, PND, lower extremity edema, any prior syncope, or recent neurologic sequela. The patient is tolerating medications without difficulties and is otherwise without complaint today.  She reports recently having an abnormal mammogram.  She has an ultrasound guided evaluation scheduled for next week.  She has had unintentional weight loss over the past few months.  Past Medical History  Diagnosis Date  . Hypertension   . Cardiomyopathy   . Abnormal mammogram     awaiting ultrasound guided biopsy  . Chronic renal insufficiency   . TIA (transient ischemic attack)    History reviewed. No pertinent past surgical  history.     Marland Kitchen acetaminophen  1,000 mg Oral BID  . allopurinol  100 mg Oral Daily  . aspirin  81 mg Oral Daily  . carvedilol  1.5625 mg Oral BID WC  . digoxin  0.125 mg Oral QHS  . docusate sodium  100 mg Oral BID  . donepezil  10 mg Oral QHS  . enoxaparin (LOVENOX) injection  40 mg Subcutaneous Q24H  . irbesartan  75 mg Oral QHS  . multivitamin with minerals  1 tablet Oral Daily  . omega-3 acid ethyl esters  1 g Oral Daily  . sodium chloride  3 mL Intravenous Q12H  . sodium chloride  3 mL Intravenous Q12H  . zolpidem  5 mg Oral QHS  . DISCONTD: acetaminophen  1,300 mg Oral BID  . DISCONTD: docusate sodium  100 mg Oral QHS  . DISCONTD: fish oil-omega-3 fatty acids  1 g Oral Daily      Allergies  Allergen Reactions  . Tape Other (See Comments)    Bruising     History   Social History  . Marital Status: Widowed    Spouse Name: N/A    Number of Children: N/A  . Years of Education: N/A   Occupational History  . Not on file.   Social History Main Topics  . Smoking status: Former Games developer  . Smokeless tobacco: Not on file  . Alcohol Use: 0.6 oz/week    1 Glasses of wine per week     rare  . Drug Use: No  . Sexually Active: Not Currently   Other  Topics Concern  . Not on file   Social History Narrative   Lives in [employer] at [place]    Family History  Problem Relation Age of Onset  . Cancer      ROS- All systems are reviewed and negative except as per the HPI above  Physical Exam: Telemetry: Filed Vitals:   03/22/12 1754 03/22/12 1953 03/23/12 0505 03/23/12 1357  BP: 150/90 145/78 144/83 128/73  Pulse: 84 67 66 70  Temp:  98.5 F (36.9 C) 98.6 F (37 C) 98.5 F (36.9 C)  TempSrc: Oral Oral Oral Oral  Resp: 18 18 19 18   Height: 5\' 2"  (1.575 m)     Weight: 119 lb 7.8 oz (54.2 kg)  117 lb 1 oz (53.1 kg)   SpO2: 96% 98% 97% 95%    GEN- The patient is well appearing, alert and oriented x 3 today.   Head- normocephalic, atraumatic Eyes-  Sclera  clear, conjunctiva pink Ears- hearing intact Oropharynx- clear Neck- supple, no JVP Lymph- no cervical lymphadenopathy Lungs- Clear to ausculation bilaterally, normal work of breathing Heart- Regular rate and rhythm, 2/6 SEM at the apex GI- soft, NT, ND, + BS Extremities- no clubbing, cyanosis, or edema MS- diffuse muscle atrophy Skin- no rash or lesion Psych- euthymic mood, full affect Neuro- strength and sensation are intact  EKG-  Sinus rhythm with LBBB  Labs:   Lab Results  Component Value Date   WBC 6.2 03/22/2012   HGB 12.6 03/22/2012   HCT 37.6 03/22/2012   MCV 94.5 03/22/2012   PLT 230 03/22/2012    Lab 03/22/12 2133  NA --  K --  CL --  CO2 --  BUN --  CREATININE 1.51*  CALCIUM --  PROT --  BILITOT --  ALKPHOS --  ALT --  AST --  GLUCOSE --    ASSESSMENT AND PLAN:   1. Cardiomyopathy-  The patient has a newly diagnosed cardiomyopathy with EF 10-15%.  She initially presented with NYHA Class III symptoms but appears to be improving with adequate medical therapy.  She has a cath planned for Monday per Dr Jacinto Halim to further evaluate her cardiomyopathy.  At this point, we are not certain as to whether she will require revascularization. Continue to titrate medical therapy. Given her LBBB, she may eventually be a candidate for CRT.  She will not be an ICD candidate however until she has been treated with an optimal medical therapy for at least 3 months. We will make further recommendations about this possibility once the results of her cath are known.  2. Presyncope- by history, episodes appear to be postural and are less likely to be due to an arrhythmia.  Given her cardiomyopathy and LBBB, I think that it would be reasonable to have an event monitor placed at discharge. She has had NSVT here for which she has been asymptomatic. She may be a LifeVest candidate, however I will defer this decision until our cath results are available.  3. Chronic renal insufficiency-  gentle hydration prior to cath on Monday  4. Abnormal Mammogram- she has plans to have an ultrasound guided biopsy next week  Hillis Range, MD 03/23/2012  2:37 PM

## 2012-03-24 LAB — BASIC METABOLIC PANEL
BUN: 35 mg/dL — ABNORMAL HIGH (ref 6–23)
CO2: 24 mEq/L (ref 19–32)
Calcium: 9.8 mg/dL (ref 8.4–10.5)
Chloride: 107 mEq/L (ref 96–112)
Creatinine, Ser: 1.6 mg/dL — ABNORMAL HIGH (ref 0.50–1.10)
GFR calc Af Amer: 33 mL/min — ABNORMAL LOW (ref >90)
GFR calc non Af Amer: 28 mL/min — ABNORMAL LOW (ref >90)
Glucose, Bld: 92 mg/dL (ref 70–99)
Potassium: 3.9 mEq/L (ref 3.5–5.1)
Sodium: 141 mEq/L (ref 135–145)

## 2012-03-24 LAB — DIGOXIN LEVEL: Digoxin Level: 0.5 ng/mL — ABNORMAL LOW (ref 0.8–2.0)

## 2012-03-24 MED ORDER — POTASSIUM CHLORIDE CRYS ER 10 MEQ PO TBCR
10.0000 meq | EXTENDED_RELEASE_TABLET | Freq: Once | ORAL | Status: AC
  Administered 2012-03-24: 10 meq via ORAL
  Filled 2012-03-24: qty 1

## 2012-03-24 MED ORDER — SODIUM CHLORIDE 0.9 % IV SOLN
1.0000 mL/kg/h | INTRAVENOUS | Status: DC
  Administered 2012-03-24: 1 mL/kg/h via INTRAVENOUS

## 2012-03-24 MED ORDER — ENOXAPARIN SODIUM 30 MG/0.3ML ~~LOC~~ SOLN
30.0000 mg | SUBCUTANEOUS | Status: DC
  Administered 2012-03-24: 30 mg via SUBCUTANEOUS
  Filled 2012-03-24 (×2): qty 0.3

## 2012-03-24 MED ORDER — SODIUM CHLORIDE 0.9 % IJ SOLN: 3.0000 mL | Freq: Two times a day (BID) | INTRAMUSCULAR | Status: DC

## 2012-03-24 MED ORDER — SODIUM CHLORIDE 0.9 % IV SOLN: 250.0000 mL | INTRAVENOUS | Status: DC | PRN

## 2012-03-24 MED ORDER — SODIUM CHLORIDE 0.9 % IJ SOLN: 3.0000 mL | INTRAMUSCULAR | Status: DC | PRN

## 2012-03-24 MED ORDER — CARVEDILOL 6.25 MG PO TABS
6.2500 mg | ORAL_TABLET | Freq: Two times a day (BID) | ORAL | Status: DC
  Administered 2012-03-24: 6.25 mg via ORAL
  Filled 2012-03-24 (×4): qty 1

## 2012-03-24 NOTE — Progress Notes (Signed)
SUBJECTIVE: The patient is doing well today.  At this time, she denies chest pain, shortness of breath, or any new concerns.  She did have leg cramps last night.     Marland Kitchen acetaminophen  1,000 mg Oral BID  . allopurinol  100 mg Oral Daily  . aspirin  81 mg Oral Daily  . carvedilol  3.125 mg Oral BID WC  . digoxin  0.125 mg Oral QHS  . docusate sodium  100 mg Oral BID  . donepezil  10 mg Oral QHS  . enoxaparin (LOVENOX) injection  40 mg Subcutaneous Q24H  . irbesartan  75 mg Oral QHS  . multivitamin with minerals  1 tablet Oral Daily  . omega-3 acid ethyl esters  1 g Oral Daily  . sodium chloride  3 mL Intravenous Q12H  . sodium chloride  3 mL Intravenous Q12H  . zolpidem  5 mg Oral QHS  . DISCONTD: carvedilol  1.5625 mg Oral BID WC      OBJECTIVE: Physical Exam: Filed Vitals:   03/23/12 1357 03/23/12 1632 03/23/12 2100 03/24/12 0519  BP: 128/73 134/75 144/71 131/73  Pulse: 70 66 67 61  Temp: 98.5 F (36.9 C)  98.6 F (37 C) 98.3 F (36.8 C)  TempSrc: Oral  Oral Oral  Resp: 18  20 19   Height:      Weight:      SpO2: 95%  97% 95%    Intake/Output Summary (Last 24 hours) at 03/24/12 0757 Last data filed at 03/23/12 1100  Gross per 24 hour  Intake    600 ml  Output      0 ml  Net    600 ml    Telemetry reveals sinus rhythm  GEN- The patient is well appearing, alert and oriented x 3 today.   Head- normocephalic, atraumatic Eyes-  Sclera clear, conjunctiva pink Ears- hearing intact Oropharynx- clear Neck- supple, no JVP Lymph- no cervical lymphadenopathy Lungs- Clear to ausculation bilaterally, normal work of breathing Heart- Regular rate and rhythm, no murmurs, rubs or gallops, PMI not laterally displaced GI- soft, NT, ND, + BS Extremities- no clubbing, cyanosis, or edema No homans or cords  LABS: Basic Metabolic Panel:  Basename 03/24/12 0600 03/22/12 2133  NA 141 --  K 3.9 --  CL 107 --  CO2 24 --  GLUCOSE 92 --  BUN 35* --  CREATININE 1.60* 1.51*   CALCIUM 9.8 --  MG -- --  PHOS -- --   Liver Function Tests: No results found for this basename: AST:2,ALT:2,ALKPHOS:2,BILITOT:2,PROT:2,ALBUMIN:2 in the last 72 hours No results found for this basename: LIPASE:2,AMYLASE:2 in the last 72 hours CBC:  Basename 03/22/12 2133  WBC 6.2  NEUTROABS --  HGB 12.6  HCT 37.6  MCV 94.5  PLT 230   RADIOLOGY: Dg Chest 2 View  03/06/2012  *RADIOLOGY REPORT*  Clinical Data: Short of breath.  Weakness.  CHEST - 2 VIEW  Comparison: 06/18/2009  Findings: The heart is enlarged.  The aorta is unfolded.  There may be venous hypertension but there is no frank edema.  No effusions. No infiltrate or collapse.  No significant bony finding.  IMPRESSION: Cardiomegaly.  Possible venous hypertension.  No frank edema.  Original Report Authenticated By: Thomasenia Sales, M.D.    ASSESSMENT AND PLAN:  Principal Problem:  *Other left bundle branch block Active Problems:  Cardiomyopathy in other diseases classified elsewhere  Syncope and collapse  Benign hypertensive kidney disease with chronic kidney disease stage I through stage  IV, or unspecified  Essential hypertension, benign  1. Cardiomyopathy- The patient has a newly diagnosed cardiomyopathy with EF 10-15%. She initially presented with NYHA Class III symptoms but appears to be improving with adequate medical therapy. She has a cath planned for Monday per Dr Jacinto Halim to further evaluate her cardiomyopathy. At this point, we are not certain as to whether she will require revascularization.  Continue to titrate medical therapy. I will increase coreg today. Given her LBBB, she may eventually be a candidate for CRT. She will not be an ICD candidate however until she has been treated with an optimal medical therapy for at least 3 months.  We will make further recommendations about this possibility once the results of her cath are known.   Risks, benefits, and alternatives to cath with possible PCI were discussed at  length with the patient who wishes to proceed.  I will gently hydrate overnight prior to cath.  2. Presyncope- by history, episodes appear to be postural and are less likely to be due to an arrhythmia. Given her cardiomyopathy and LBBB, I think that it would be reasonable to have an event monitor placed at discharge.  She has had NSVT here for which she has been asymptomatic.  She may be a LifeVest candidate, however I will defer this decision until our cath results are available.   3. Chronic renal insufficiency- gentle hydration prior to cath on Monday   4. Abnormal Mammogram- she has plans to have an ultrasound guided biopsy next week    Hillis Range, MD 03/24/2012 7:57 AM

## 2012-03-24 NOTE — Progress Notes (Signed)
03/24/2012 6:48 PM Nursing note Paged Dr. Jacinto Halim regarding cath order duplication in EPIC. Verbal orders received to keep cath orders placed by Dr. Johney Frame earlier in shift. Orders enacted. Valma Rotenberg, Blanchard Kelch

## 2012-03-25 ENCOUNTER — Ambulatory Visit (HOSPITAL_COMMUNITY): Admit: 2012-03-25 | Payer: Self-pay | Admitting: Internal Medicine

## 2012-03-25 ENCOUNTER — Encounter (HOSPITAL_COMMUNITY)
Admission: AD | Disposition: A | Discharge status: Home / Self Care | Payer: Self-pay | Source: Ambulatory Visit | Attending: Cardiology

## 2012-03-25 HISTORY — PX: LEFT HEART CATHETERIZATION WITH CORONARY ANGIOGRAM: SHX5451

## 2012-03-25 HISTORY — PX: CARDIAC CATHETERIZATION: SHX172

## 2012-03-25 LAB — CBC
HCT: 35.7 % — ABNORMAL LOW (ref 36.0–46.0)
MCH: 31.9 pg (ref 26.0–34.0)
MCV: 94.9 fL (ref 78.0–100.0)
Platelets: 204 10*3/uL (ref 150–400)
RBC: 3.76 MIL/uL — ABNORMAL LOW (ref 3.87–5.11)
RDW: 13.7 % (ref 11.5–15.5)

## 2012-03-25 LAB — BASIC METABOLIC PANEL
BUN: 36 mg/dL — ABNORMAL HIGH (ref 6–23)
CO2: 23 mEq/L (ref 19–32)
Calcium: 9.7 mg/dL (ref 8.4–10.5)
Chloride: 106 mEq/L (ref 96–112)
Creatinine, Ser: 1.67 mg/dL — ABNORMAL HIGH (ref 0.50–1.10)

## 2012-03-25 SURGERY — LEFT HEART CATHETERIZATION WITH CORONARY ANGIOGRAM
Anesthesia: LOCAL

## 2012-03-25 SURGERY — IMPLANTABLE CARDIOVERTER DEFIBRILLATOR IMPLANT
Anesthesia: LOCAL

## 2012-03-25 MED ORDER — LIDOCAINE HCL (PF) 1 % IJ SOLN
INTRAMUSCULAR | Status: AC
  Filled 2012-03-25: qty 30

## 2012-03-25 MED ORDER — FENTANYL CITRATE 0.05 MG/ML IJ SOLN
INTRAMUSCULAR | Status: AC
  Filled 2012-03-25: qty 2

## 2012-03-25 MED ORDER — ACETAMINOPHEN 325 MG PO TABS: 650.0000 mg | ORAL_TABLET | ORAL | Status: DC | PRN

## 2012-03-25 MED ORDER — MIDAZOLAM HCL 2 MG/2ML IJ SOLN
INTRAMUSCULAR | Status: AC
  Filled 2012-03-25: qty 2

## 2012-03-25 MED ORDER — SODIUM CHLORIDE 0.9 % IV SOLN: INTRAVENOUS | Status: DC

## 2012-03-25 MED ORDER — HEPARIN (PORCINE) IN NACL 2-0.9 UNIT/ML-% IJ SOLN
INTRAMUSCULAR | Status: AC
  Filled 2012-03-25: qty 2000

## 2012-03-25 MED ORDER — NITROGLYCERIN 0.2 MG/ML ON CALL CATH LAB
INTRAVENOUS | Status: AC
  Filled 2012-03-25: qty 1

## 2012-03-25 MED ORDER — ONDANSETRON HCL 4 MG/2ML IJ SOLN: 4.0000 mg | Freq: Four times a day (QID) | INTRAMUSCULAR | Status: DC | PRN

## 2012-03-25 MED ORDER — VERAPAMIL HCL 2.5 MG/ML IV SOLN
INTRAVENOUS | Status: AC
  Filled 2012-03-25: qty 2

## 2012-03-25 NOTE — Progress Notes (Signed)
Small skin tear noted after removing plastic tape securing IV catheter; tegaderm applied with pt. Instructions to observe site frequently for infection, swelling, bleeding.  Instructions also given for cargivers at [employer]

## 2012-03-25 NOTE — Progress Notes (Signed)
Discharged home; right radial dressing CDI without bleed/hematoma; no c/o

## 2012-03-25 NOTE — Interval H&P Note (Signed)
History and Physical Interval Note:  03/25/2012 7:31 AM  Lynn [name]  has presented today for surgery, with the diagnosis of chf  The various methods of treatment have been discussed with the patient and family. After consideration of risks, benefits and other options for treatment, the patient has consented to  Procedure(s) (LRB): LEFT HEART CATHETERIZATION WITH CORONARY ANGIOGRAM (N/A) and possible angioplasty as a surgical intervention .  The patient's history has been reviewed, patient examined, no change in status, stable for surgery.  I have reviewed the patient's chart and labs.  Questions were answered to the patient's satisfaction.     Pamella Pert

## 2012-03-25 NOTE — Discharge Summary (Signed)
Physician Discharge Summary  Patient ID: Lynn [name] MRN: [id number] DOB/AGE: 1924-12-15 78 y.o.  Admit date: 03/22/2012 Discharge date: 03/25/2012  Primary Discharge Diagnosis Syncope due to orthostasis and orthostatic hypotension. Cardiomyopathy with severe LV systolic dysfunction Chronic systolic and diastolic heart failure Left bundle-branch block Nonsustained ventricular tachycardia on telemetry. Secondary Discharge Diagnosis Hypertension Chronic renal failure Remote TIA without residual neurological deficits  Significant Diagnostic Studies: Cardiac catheterization on 03/25/2012 revealing no significant coronary artery disease. Ejection fraction less than 25% with global hypokinesis. Approximately 20-25 cc of contrast was utilized for diagnostic angiography.  Consults: Dr. Hillis Range, for consideration for ICD implantation for primary prevention of sudden cardiac death and also to evaluate for heart given her advanced age and left bundle branch block. He felt the symptoms of syncope related to orthostasis. Felt it was safe for her to be discharged on aggressive medical therapy and she evaluate her ejection fraction in 3 months for possible CRT.  Hospital Course: Lynn [name] is a very pleasant and fairly active 44 femoral Caucasian female who was referred to me for evaluation of syncope. Symptoms started about 2-3 weeks ago. She was also seen in the emergency department about a week ago with near syncope. She had an outpatient echocardiogram which revealed severe LV systolic dysfunction with ejection fraction of 10-15% and dilated myopathy. Arrhythmias, specifically ventricular arrhythmias and or complete heart block or conduction system disease was a working diagnosis. She was admitted to the hospital and being the weekend the was treated with cardiac catheterization on Monday today's 03/25/2012. She is nonischemic cardiomyopathy. After discussions with electrophysiologist it was felt it  was safe for her to be discharged with outpatient monitoring.   Discharge Exam: Blood pressure 129/74, pulse 63, temperature 98.1 F (36.7 C), temperature source Oral, resp. rate 19, height 5\' 2"  (1.575 m), weight 53.1 kg (117 lb 1 oz), SpO2 93.00%.   GEN- The patient is well appearing, alert and oriented x 3 today.  Head- normocephalic, atraumatic  Eyes- Sclera clear, conjunctiva pink  Ears- hearing intact  Oropharynx- clear  Neck- supple, no JVP  Lymph- no cervical lymphadenopathy  Lungs- Clear to ausculation bilaterally, normal work of breathing  Heart- Regular rate and rhythm, 2/6 SEM at the apex  GI- soft, NT, ND, + BS  Extremities- no clubbing, cyanosis, or edema  MS- diffuse muscle atrophy  Skin- no rash or lesion  Psych- euthymic mood, full affect  Neuro- strength and sensation are intact  Labs:   Lab Results  Component Value Date   WBC 4.7 03/25/2012   HGB 12.0 03/25/2012   HCT 35.7* 03/25/2012   MCV 94.9 03/25/2012   PLT 204 03/25/2012    Lab 03/25/12 0504  NA 138  K 4.2  CL 106  CO2 23  BUN 36*  CREATININE 1.67*  CALCIUM 9.7  PROT --  BILITOT --  ALKPHOS --  ALT --  AST --  GLUCOSE 89     Radiology: Dg Chest 2 View  03/06/2012  *RADIOLOGY REPORT*  Clinical Data: Short of breath.  Weakness.  CHEST - 2 VIEW  Comparison: 06/18/2009  Findings: The heart is enlarged.  The aorta is unfolded.  There may be venous hypertension but there is no frank edema.  No effusions. No infiltrate or collapse.  No significant bony finding.  IMPRESSION: Cardiomegaly.  Possible venous hypertension.  No frank edema.  Original Report Authenticated By: Thomasenia Sales, M.D.    EKG: Normal sinus rhythm, left bundle branch block. No  further evaluation performed due to presence of left bundle branch block. Brief nonsustained ventricular tachycardia on telemetry. Asymptomatic.   FOLLOW UP PLANS AND APPOINTMENTS  Medication List  As of 03/25/2012  8:15 AM   TAKE these medications          acetaminophen 650 MG CR tablet   Commonly known as: TYLENOL   Take 1,300 mg by mouth 2 (two) times daily.      allopurinol 100 MG tablet   Commonly known as: ZYLOPRIM   Take 100 mg by mouth daily.      aspirin 81 MG chewable tablet   Chew 81 mg by mouth daily.      carvedilol 3.125 MG tablet   Commonly known as: COREG   Take 1.5625 mg by mouth 2 (two) times daily with a meal.      CoQ10 200 MG Caps   Take 1 capsule by mouth daily.      digoxin 0.125 MG tablet   Commonly known as: LANOXIN   Take 0.125 mg by mouth at bedtime.      docusate sodium 100 MG capsule   Commonly known as: COLACE   Take 100 mg by mouth at bedtime.      donepezil 10 MG tablet   Commonly known as: ARICEPT   Take 10 mg by mouth at bedtime.      fish oil-omega-3 fatty acids 1000 MG capsule   Take 1 g by mouth daily.      multivitamin with minerals Tabs   Take 1 tablet by mouth daily.      olmesartan 20 MG tablet   Commonly known as: BENICAR   Take 10 mg by mouth at bedtime.      zolpidem 5 MG tablet   Commonly known as: AMBIEN   Take 5 mg by mouth at bedtime as needed. For sleep           Follow-up Information    Call Pamella Pert, MD. (Needs Event monitor for 30 days.)    Contact information:   [address]  [place] [address] [phone]           Pamella Pert, MD 03/25/2012, 8:15 AM

## 2012-03-25 NOTE — CV Procedure (Signed)
Procedure performed:  Left heart catheterization including hemodynamic monitoring of the left ventricle, LV gram,  selective right and left coronary arteriography.  Indication patient is a 78 year-old woman with history of hypertension,  hyperlipidemia,  who presents with syncope. Patient has  had non invasive testing which was abnormal in the form of severe LV systolic dysfunction. She was seen Friday 03/22/12 and was admitted to telemetry for possible arrhythmic etiology for syncope.   Hence is brought to the cardiac catheterization lab to evaluate her coronary anatomy for definitive diagnosis of CAD.  Hemodynamic data:  Left ventricular pressure was 100/-2 with LVEDP of 2 mm mercury. Aortic pressure was 98/51 with a mean of 68 mm mercury.  Left ventricle: Performed in the RAO projection revealed LVEF of 25%. There was No significant MR. Global hypokinesis was Wall motion abnormality.  Right coronary artery: The vessel is smooth,mid segment 30% stenosis,  Dominant.  Left main coronary artery is large and normal. Mild calcification noted.  Circumflex coronary artery: A large vessel giving origin to a large obtuse marginal 1.   LAD:  LAD gives origin to a large diagonal-1.  LAD has mild luminal irregularities.   Rec: Medical therapy with aggressive risk factor reduction. Discussed with Dr. Hillis Range and Dr. Sharrell Ku regarding her presentation. At this time it is felt that she can be safely be discharged home and that her syncope was probably related to orthostasis. She did have an episode of nonsustained ventricular tachycardia while on telemetry. However, she was asymptomatic without any dizziness. There was no sustained arrhythmia noted. Hence felt safe for discharge. Patient will have outpatient event monitor for 30 days. A total of 20 cc of contrast and 25 cc of contrast was utilized for diagnostic angiography.  Disposition: Will be discharged home today with outpatient follow  up.  Technique: Under sterile precautions using a 6 French right radial  arterial access, a 6 French sheath was introduced into the right radial artery. A 5 [place] Tig 4 catheter was advanced into the ascending aorta selective  right coronary artery and left coronary artery was cannulated and angiography was performed in multiple views. The catheter was pulled back Out of the body over exchange length J-wire.  Same Catheter was used to perform LV gram which was performed in LAO projection.  Catheter exchanged out of the body over J-Wire. NO immediate complications noted. Patient tolerated the procedure well.

## 2012-04-02 ENCOUNTER — Other Ambulatory Visit: Payer: Self-pay

## 2012-05-02 DIAGNOSIS — I493 Ventricular premature depolarization: Secondary | ICD-10-CM

## 2012-05-02 HISTORY — DX: Ventricular premature depolarization: I49.3

## 2012-06-23 ENCOUNTER — Encounter (HOSPITAL_COMMUNITY): Payer: Self-pay

## 2012-06-23 ENCOUNTER — Emergency Department (HOSPITAL_COMMUNITY)
Admission: EM | Admit: 2012-06-23 | Discharge: 2012-06-23 | Disposition: A | Discharge status: Home / Self Care | Payer: Medicare Other | Attending: Emergency Medicine | Admitting: Emergency Medicine

## 2012-06-23 ENCOUNTER — Emergency Department (HOSPITAL_COMMUNITY): Payer: Medicare Other

## 2012-06-23 DIAGNOSIS — Y939 Activity, unspecified: Secondary | ICD-10-CM | POA: Insufficient documentation

## 2012-06-23 DIAGNOSIS — I428 Other cardiomyopathies: Secondary | ICD-10-CM | POA: Insufficient documentation

## 2012-06-23 DIAGNOSIS — S0512XA Contusion of eyeball and orbital tissues, left eye, initial encounter: Secondary | ICD-10-CM

## 2012-06-23 DIAGNOSIS — I129 Hypertensive chronic kidney disease with stage 1 through stage 4 chronic kidney disease, or unspecified chronic kidney disease: Secondary | ICD-10-CM | POA: Insufficient documentation

## 2012-06-23 DIAGNOSIS — Z87891 Personal history of nicotine dependence: Secondary | ICD-10-CM | POA: Insufficient documentation

## 2012-06-23 DIAGNOSIS — S0993XA Unspecified injury of face, initial encounter: Secondary | ICD-10-CM | POA: Insufficient documentation

## 2012-06-23 DIAGNOSIS — Z8673 Personal history of transient ischemic attack (TIA), and cerebral infarction without residual deficits: Secondary | ICD-10-CM | POA: Insufficient documentation

## 2012-06-23 DIAGNOSIS — S0511XA Contusion of eyeball and orbital tissues, right eye, initial encounter: Secondary | ICD-10-CM

## 2012-06-23 DIAGNOSIS — Z7982 Long term (current) use of aspirin: Secondary | ICD-10-CM | POA: Insufficient documentation

## 2012-06-23 DIAGNOSIS — M542 Cervicalgia: Secondary | ICD-10-CM

## 2012-06-23 DIAGNOSIS — S199XXA Unspecified injury of neck, initial encounter: Secondary | ICD-10-CM | POA: Insufficient documentation

## 2012-06-23 DIAGNOSIS — S0010XA Contusion of unspecified eyelid and periocular area, initial encounter: Secondary | ICD-10-CM | POA: Insufficient documentation

## 2012-06-23 DIAGNOSIS — R51 Headache: Secondary | ICD-10-CM | POA: Insufficient documentation

## 2012-06-23 DIAGNOSIS — Y929 Unspecified place or not applicable: Secondary | ICD-10-CM | POA: Insufficient documentation

## 2012-06-23 DIAGNOSIS — R296 Repeated falls: Secondary | ICD-10-CM | POA: Insufficient documentation

## 2012-06-23 DIAGNOSIS — Z79899 Other long term (current) drug therapy: Secondary | ICD-10-CM | POA: Insufficient documentation

## 2012-06-23 DIAGNOSIS — N189 Chronic kidney disease, unspecified: Secondary | ICD-10-CM | POA: Insufficient documentation

## 2012-06-23 HISTORY — DX: Unspecified kidney failure: N19

## 2012-06-23 HISTORY — DX: Contusion of eyeball and orbital tissues, left eye, initial encounter: S05.12XA

## 2012-06-23 MED ORDER — OXYCODONE-ACETAMINOPHEN 5-325 MG PO TABS
1.0000 | ORAL_TABLET | Freq: Once | ORAL | Status: AC
  Administered 2012-06-23: 1 via ORAL
  Filled 2012-06-23: qty 1

## 2012-06-23 MED ORDER — HYDROCODONE-ACETAMINOPHEN 5-500 MG PO TABS: 1.0000 | ORAL_TABLET | Freq: Four times a day (QID) | ORAL | Status: DC | PRN

## 2012-06-23 NOTE — ED Provider Notes (Addendum)
History     CSN: [id number]  Arrival date & time 06/23/12  0541   First MD Initiated Contact with Patient 06/23/12 [phone]      Chief Complaint  Patient presents with  . Neck Pain    (Consider location/radiation/quality/duration/timing/severity/associated sxs/prior treatment) Patient is a 78 y.o. female presenting with neck pain. The history is provided by the patient.  Neck Pain  Associated symptoms include headaches. Pertinent negatives include no chest pain and no weakness.   78 year old, female, complains of headache, and neck pain.  Since she fell 3 days ago.  She states that she does not have vision changes, nausea, vomiting, weakness, or paresthesias in her upper or lower extremities.  Besides her head and neck.  She has no other pain.  She was seen by EMS after her fall, but she was not brought to the emergency department.  Past Medical History  Diagnosis Date  . Hypertension   . Cardiomyopathy   . Abnormal mammogram     awaiting ultrasound guided biopsy  . Chronic renal insufficiency   . TIA (transient ischemic attack)   . LBBB (left bundle branch block)   . Kidney failure     no dialysis    History reviewed. No pertinent past surgical history.  Family History  Problem Relation Age of Onset  . Cancer      History  Substance Use Topics  . Smoking status: Former Games developer  . Smokeless tobacco: Not on file  . Alcohol Use: 0.6 oz/week    1 Glasses of wine per week     rare    OB History    Grav Para Term Preterm Abortions TAB SAB Ect Mult Living                  Review of Systems  Constitutional: Negative for fever.  HENT: Positive for neck pain.   Eyes: Negative for visual disturbance.  Cardiovascular: Negative for chest pain.  Gastrointestinal: Negative for nausea and vomiting.  Genitourinary: Negative for flank pain.  Musculoskeletal: Negative for back pain.  Neurological: Positive for headaches. Negative for weakness.  Hematological: Bruises/bleeds  easily.       Right periorbital hematoma  Psychiatric/Behavioral: Negative for confusion.  All other systems reviewed and are negative.    Allergies  Tape  Home Medications   Current Outpatient Rx  Name Route Sig Dispense Refill  . ACETAMINOPHEN 500 MG PO TABS Oral Take 1,000 mg by mouth 2 (two) times daily as needed. For pain    . ALLOPURINOL 100 MG PO TABS Oral Take 100 mg by mouth daily.    . ASPIRIN 81 MG PO CHEW Oral Chew 81 mg by mouth daily.    Marland Kitchen CARVEDILOL 6.25 MG PO TABS Oral Take 6.25 mg by mouth 2 (two) times daily with a meal.    . COQ10 200 MG PO CAPS Oral Take 1 capsule by mouth daily.    Marland Kitchen DIGOXIN 0.125 MG PO TABS Oral Take 0.125 mg by mouth at bedtime.    . DONEPEZIL HCL 10 MG PO TABS Oral Take 10 mg by mouth at bedtime.    . OMEGA-3 FATTY ACIDS 1000 MG PO CAPS Oral Take 1 g by mouth daily.     Marland Kitchen HYDROCODONE-ACETAMINOPHEN 5-325 MG PO TABS Oral Take 0.5-1 tablets by mouth every 6 (six) hours as needed. For pain    . ADULT MULTIVITAMIN W/MINERALS CH Oral Take 1 tablet by mouth daily.    . SENNA 8.6 MG PO TABS  Oral Take 1 tablet by mouth daily.    . TELMISARTAN 40 MG PO TABS Oral Take 40 mg by mouth daily.    Marland Kitchen ZOLPIDEM TARTRATE 5 MG PO TABS Oral Take 2.5 mg by mouth at bedtime as needed. For sleep      BP 141/68  Pulse 75  Temp 98.6 F (37 C) (Oral)  Resp 21  SpO2 100%  Physical Exam  Nursing note and vitals reviewed. Constitutional: She is oriented to person, place, and time. She appears well-developed and well-nourished. No distress.  HENT:  Head: Normocephalic.       Contusion right forehead  Eyes: Conjunctivae normal and EOM are normal.       Right periorbital hematoma  Neck: Normal range of motion. Neck supple.       ttp at base of skull and upper c-spine  Cardiovascular: Normal rate and regular rhythm.   Murmur heard. Pulmonary/Chest: Effort normal and breath sounds normal. No respiratory distress.  Abdominal: Soft. She exhibits no distension.    Musculoskeletal: Normal range of motion. She exhibits no edema.  Neurological: She is alert and oriented to person, place, and time. No cranial nerve deficit.       Nl strength bilat upper and lower extremities  Skin: Skin is warm and dry.       Right periorbital hematoma  Psychiatric: She has a normal mood and affect. Thought content normal.    ED Course  Procedures (including critical care time)  Labs Reviewed - No data to display No results found.   No diagnosis found.  10:00 AM Ha improved.     MDM  Ha and neck pain after fall.  Normal ms and neuro exam. Right periorbital hematoma        Cheri Guppy, MD 06/23/12 1610  Cheri Guppy, MD 06/23/12 1000

## 2012-06-23 NOTE — ED Notes (Signed)
Patient transported to CT 

## 2012-06-23 NOTE — ED Notes (Signed)
Per EMS, pt fell last Thursday but did not come to ER. States increasing neck pain and HA starting last night.

## 2012-06-23 NOTE — Discharge Instructions (Signed)
Your CAT scans, do not show any injury to your brain or broken bones or dislocations.  Use hydrocodone for headache.  Followup with your Dr. if your symptoms.  Last more than 3-4 days.  Return for worse or uncontrolled symptoms

## 2012-06-23 NOTE — ED Notes (Signed)
Pt discharged to home with family. Pt voiced understanding of discharge instructions. NAD.  

## 2012-06-23 NOTE — ED Notes (Signed)
Pt. Reports fall on 06/20/12, did not come to ER. Pt. Denies pain at time of fall. States "Yesterday I got a HA and my neck/shoulders started hurting, I haven't been able to sleep all night".

## 2012-06-24 ENCOUNTER — Other Ambulatory Visit: Payer: Self-pay | Admitting: Internal Medicine

## 2012-06-24 ENCOUNTER — Ambulatory Visit
Admission: RE | Admit: 2012-06-24 | Discharge: 2012-06-24 | Disposition: A | Discharge status: Home / Self Care | Payer: Medicare Other | Source: Ambulatory Visit | Attending: Internal Medicine | Admitting: Internal Medicine

## 2012-06-24 DIAGNOSIS — W19XXXA Unspecified fall, initial encounter: Secondary | ICD-10-CM

## 2012-06-24 DIAGNOSIS — R52 Pain, unspecified: Secondary | ICD-10-CM

## 2012-06-26 ENCOUNTER — Emergency Department (HOSPITAL_COMMUNITY): Payer: Medicare Other

## 2012-06-26 ENCOUNTER — Emergency Department (HOSPITAL_COMMUNITY)
Admission: EM | Admit: 2012-06-26 | Discharge: 2012-06-26 | Disposition: A | Discharge status: Home / Self Care | Payer: Medicare Other | Attending: Emergency Medicine | Admitting: Emergency Medicine

## 2012-06-26 ENCOUNTER — Encounter (HOSPITAL_COMMUNITY): Payer: Self-pay

## 2012-06-26 DIAGNOSIS — N189 Chronic kidney disease, unspecified: Secondary | ICD-10-CM | POA: Insufficient documentation

## 2012-06-26 DIAGNOSIS — I447 Left bundle-branch block, unspecified: Secondary | ICD-10-CM | POA: Insufficient documentation

## 2012-06-26 DIAGNOSIS — I129 Hypertensive chronic kidney disease with stage 1 through stage 4 chronic kidney disease, or unspecified chronic kidney disease: Secondary | ICD-10-CM | POA: Insufficient documentation

## 2012-06-26 DIAGNOSIS — Z8673 Personal history of transient ischemic attack (TIA), and cerebral infarction without residual deficits: Secondary | ICD-10-CM | POA: Insufficient documentation

## 2012-06-26 DIAGNOSIS — M545 Low back pain, unspecified: Secondary | ICD-10-CM | POA: Insufficient documentation

## 2012-06-26 DIAGNOSIS — Z87891 Personal history of nicotine dependence: Secondary | ICD-10-CM | POA: Insufficient documentation

## 2012-06-26 DIAGNOSIS — Z79899 Other long term (current) drug therapy: Secondary | ICD-10-CM | POA: Insufficient documentation

## 2012-06-26 DIAGNOSIS — Z7982 Long term (current) use of aspirin: Secondary | ICD-10-CM | POA: Insufficient documentation

## 2012-06-26 DIAGNOSIS — N390 Urinary tract infection, site not specified: Secondary | ICD-10-CM

## 2012-06-26 DIAGNOSIS — M542 Cervicalgia: Secondary | ICD-10-CM | POA: Insufficient documentation

## 2012-06-26 LAB — BASIC METABOLIC PANEL
BUN: 39 mg/dL — ABNORMAL HIGH (ref 6–23)
CO2: 23 mEq/L (ref 19–32)
Chloride: 101 mEq/L (ref 96–112)
Creatinine, Ser: 1.74 mg/dL — ABNORMAL HIGH (ref 0.50–1.10)

## 2012-06-26 LAB — URINALYSIS, ROUTINE W REFLEX MICROSCOPIC
Glucose, UA: NEGATIVE mg/dL
Ketones, ur: NEGATIVE mg/dL
pH: 5.5 (ref 5.0–8.0)

## 2012-06-26 LAB — CBC WITH DIFFERENTIAL/PLATELET
HCT: 31.6 % — ABNORMAL LOW (ref 36.0–46.0)
Hemoglobin: 10.6 g/dL — ABNORMAL LOW (ref 12.0–15.0)
Lymphocytes Relative: 13 % (ref 12–46)
Lymphs Abs: 1.2 10*3/uL (ref 0.7–4.0)
Monocytes Absolute: 1 10*3/uL (ref 0.1–1.0)
Monocytes Relative: 11 % (ref 3–12)
Neutro Abs: 6.4 10*3/uL (ref 1.7–7.7)
WBC: 8.8 10*3/uL (ref 4.0–10.5)

## 2012-06-26 LAB — URINE MICROSCOPIC-ADD ON

## 2012-06-26 MED ORDER — MORPHINE SULFATE 4 MG/ML IJ SOLN
4.0000 mg | Freq: Once | INTRAMUSCULAR | Status: AC
  Administered 2012-06-26: 4 mg via INTRAVENOUS
  Filled 2012-06-26: qty 1

## 2012-06-26 MED ORDER — ONDANSETRON HCL 4 MG/2ML IJ SOLN
4.0000 mg | Freq: Once | INTRAMUSCULAR | Status: AC
  Administered 2012-06-26: 4 mg via INTRAVENOUS
  Filled 2012-06-26: qty 2

## 2012-06-26 MED ORDER — DEXTROSE 5 % IV SOLN
1.0000 g | INTRAVENOUS | Status: DC
  Administered 2012-06-26: 1 g via INTRAVENOUS
  Filled 2012-06-26: qty 10

## 2012-06-26 MED ORDER — CEPHALEXIN 500 MG PO CAPS: 500.0000 mg | ORAL_CAPSULE | Freq: Four times a day (QID) | ORAL | Status: DC

## 2012-06-26 NOTE — ED Provider Notes (Signed)
Medical screening examination/treatment/procedure(s) were performed by non-physician practitioner and as supervising physician I was immediately available for consultation/collaboration.  Marlyn Tondreau R. Stamatia Masri, MD 06/26/12 1642 

## 2012-06-26 NOTE — ED Notes (Signed)
Pt fell last week and was seen for same and seen here on Sunday, sts continues to have pain attacks.

## 2012-06-26 NOTE — ED Notes (Signed)
Pt also reports periods of unresponsiveness and confusion.

## 2012-06-26 NOTE — ED Provider Notes (Signed)
History     CSN: [id number]  Arrival date & time 06/26/12  1056   First MD Initiated Contact with Patient 06/26/12 1129      Chief Complaint  Patient presents with  . Back Pain    (Consider location/radiation/quality/duration/timing/severity/associated sxs/prior treatment) HPI  Lynn [name] is a 78 y.o. female complaining of severe back pain described as shattering. Not relived by [place]. Pt has a fall last Thursday and had initial and interval head CT's to evaluate for bleed. Initial pain was in the head, neck,  Shoulders. Denies difficulty ambulating no h/o Ca, fever, change in bowel or bladder habits. Pain is rated at 7/10 now not exacerbated by movement. As per daughter, Pt is not as sharp as she normally is.   Past Medical History  Diagnosis Date  . Hypertension   . Cardiomyopathy   . Abnormal mammogram     awaiting ultrasound guided biopsy  . Chronic renal insufficiency   . TIA (transient ischemic attack)   . LBBB (left bundle branch block)   . Kidney failure     no dialysis    No past surgical history on file.  Family History  Problem Relation Age of Onset  . Cancer      History  Substance Use Topics  . Smoking status: Former Games developer  . Smokeless tobacco: Not on file  . Alcohol Use: 0.6 oz/week    1 Glasses of wine per week     rare    OB History    Grav Para Term Preterm Abortions TAB SAB Ect Mult Living                  Review of Systems  Constitutional: Negative for fever.  HENT: Positive for neck pain.   Respiratory: Negative for shortness of breath.   Cardiovascular: Negative for chest pain.  Gastrointestinal: Negative for nausea, vomiting, abdominal pain and diarrhea.  Genitourinary: Negative for dysuria and difficulty urinating.  Musculoskeletal: Positive for back pain.  Neurological: Negative for weakness and numbness.  All other systems reviewed and are negative.    Allergies  Tape  Home Medications   Current Outpatient Rx    Name Route Sig Dispense Refill  . ALLOPURINOL 100 MG PO TABS Oral Take 100 mg by mouth daily.    . ASPIRIN EC 81 MG PO TBEC Oral Take 81 mg by mouth daily.    Marland Kitchen CARVEDILOL 6.25 MG PO TABS Oral Take 6.25 mg by mouth 2 (two) times daily with a meal.    . COQ10 200 MG PO CAPS Oral Take 1 capsule by mouth daily.    Marland Kitchen DIGOXIN 0.125 MG PO TABS Oral Take 0.125 mg by mouth at bedtime.    . DONEPEZIL HCL 10 MG PO TABS Oral Take 10 mg by mouth at bedtime.    . OMEGA-3 FATTY ACIDS 1000 MG PO CAPS Oral Take 1,000 mg by mouth daily.     Marland Kitchen HYDROCODONE-ACETAMINOPHEN 5-325 MG PO TABS Oral Take 1 tablet by mouth every 8 (eight) hours as needed. For pain    . ADULT MULTIVITAMIN W/MINERALS CH Oral Take 1 tablet by mouth daily.    Marland Kitchen ZOLPIDEM TARTRATE 5 MG PO TABS Oral Take 2.5 mg by mouth at bedtime as needed. For sleep      BP 114/65  Pulse 90  Temp 98 F (36.7 C) (Oral)  Resp 18  SpO2 95%  Physical Exam  Nursing note and vitals reviewed. Constitutional: She is oriented to person, place,  and time. She appears well-developed and well-nourished. No distress.       Pt in soft C-spine collar  HENT:  Head: Normocephalic and atraumatic.  Mouth/Throat: Oropharynx is clear and moist.  Eyes: Conjunctivae normal and EOM are normal. Pupils are equal, round, and reactive to light.  Neck: Normal range of motion.  Cardiovascular: Normal rate and intact distal pulses.   Pulmonary/Chest: Effort normal and breath sounds normal. No stridor. No respiratory distress. She has no wheezes. She has no rales. She exhibits no tenderness.  Abdominal: Soft. Bowel sounds are normal. She exhibits no distension and no mass. There is no tenderness. There is no rebound and no guarding.  Musculoskeletal: Normal range of motion.       No midline TTP or step-offs appreciated. Pain with ROM.   Neurological: She is alert and oriented to person, place, and time.  Skin:       Multiple eccymoses, well-healing abrasion over left eye.    Psychiatric: She has a normal mood and affect.    ED Course  Procedures (including critical care time)  Labs Reviewed  URINALYSIS, ROUTINE W REFLEX MICROSCOPIC - Abnormal; Notable for the following:    APPearance CLOUDY (*)     Hgb urine dipstick SMALL (*)     Protein, ur 30 (*)     Nitrite POSITIVE (*)     Leukocytes, UA LARGE (*)     All other components within normal limits  CBC WITH DIFFERENTIAL - Abnormal; Notable for the following:    RBC 3.38 (*)     Hemoglobin 10.6 (*)     HCT 31.6 (*)     All other components within normal limits  BASIC METABOLIC PANEL - Abnormal; Notable for the following:    Glucose, Bld 106 (*)     BUN 39 (*)     Creatinine, Ser 1.74 (*)     GFR calc non Af Amer 26 (*)     GFR calc Af Amer 30 (*)     All other components within normal limits  URINE MICROSCOPIC-ADD ON - Abnormal; Notable for the following:    Squamous Epithelial / LPF FEW (*)     Bacteria, UA MANY (*)     Casts GRANULAR CAST (*)     All other components within normal limits  URINE CULTURE   Dg Thoracic Spine W/swimmers  06/26/2012  *RADIOLOGY REPORT*  Clinical Data: Rule out fracture  THORACIC SPINE - 2 VIEW + SWIMMERS  Comparison: None.  Findings: Three views of thoracic spine submitted.  No acute fracture or subluxation.  Mild degenerative changes lower thoracic spine.  There is  lower thoracic mild dextroscoliosis.  IMPRESSION: No acute fracture or subluxation.  Mild degenerative changes.  Mild dextroscoliosis lower thoracic spine.   Original Report Authenticated By: Natasha Mead, M.D.    Dg Lumbar Spine Complete  06/26/2012  *RADIOLOGY REPORT*  Clinical Data: Back pain, rule out fracture  LUMBAR SPINE - COMPLETE 4+ VIEW  Comparison: 03/28/2011  Findings: Five views of the lumbar spine submitted. Dextroscoliosis upper lumbar spine.  Large left lateral osteophytes at L1-L2 and L2-L3 level.  Mild left and right lateral osteophytes at L3-L4 level.  No acute fracture or subluxation.   Multilevel disc space flattening.  Endplate sclerotic changes noted at L1-L2 level.  IMPRESSION: No acute fracture or subluxation.  Multilevel degenerative changes as described above.   Original Report Authenticated By: Natasha Mead, M.D.      1. UTI (lower urinary tract infection)  2. Low back pain       MDM  Pt with thoracic and low back pain starting yesterday.   UA is c/w URI, I will give her 1g of rocephin and send culture.   Thoracic and lumbar XRs show no acute abnormalities.   VSS and pain is controlled.  Pt verbalized understanding and agrees with care plan. Outpatient follow-up and return precautions given.     New Prescriptions   CEPHALEXIN (KEFLEX) 500 MG CAPSULE    Take 1 capsule (500 mg total) by mouth 4 (four) times daily.          Wynetta Emery, PA-C 06/26/12 1546

## 2012-06-28 LAB — URINE CULTURE

## 2012-06-29 NOTE — ED Notes (Signed)
+  Urine. Patient given Keflex. Sensitive to same. Per protocol MD. °

## 2012-11-11 ENCOUNTER — Other Ambulatory Visit: Payer: Self-pay | Admitting: Dermatology

## 2013-01-29 ENCOUNTER — Encounter (HOSPITAL_COMMUNITY): Payer: Self-pay | Admitting: Emergency Medicine

## 2013-01-29 ENCOUNTER — Emergency Department (HOSPITAL_COMMUNITY)
Admission: EM | Admit: 2013-01-29 | Discharge: 2013-01-29 | Disposition: A | Discharge status: Home / Self Care | Payer: Medicare Other | Attending: Emergency Medicine | Admitting: Emergency Medicine

## 2013-01-29 ENCOUNTER — Emergency Department (HOSPITAL_COMMUNITY): Payer: Medicare Other

## 2013-01-29 DIAGNOSIS — R509 Fever, unspecified: Secondary | ICD-10-CM | POA: Insufficient documentation

## 2013-01-29 DIAGNOSIS — Z79899 Other long term (current) drug therapy: Secondary | ICD-10-CM | POA: Insufficient documentation

## 2013-01-29 DIAGNOSIS — R109 Unspecified abdominal pain: Secondary | ICD-10-CM

## 2013-01-29 DIAGNOSIS — Z7982 Long term (current) use of aspirin: Secondary | ICD-10-CM | POA: Insufficient documentation

## 2013-01-29 DIAGNOSIS — R112 Nausea with vomiting, unspecified: Secondary | ICD-10-CM | POA: Insufficient documentation

## 2013-01-29 DIAGNOSIS — N19 Unspecified kidney failure: Secondary | ICD-10-CM | POA: Insufficient documentation

## 2013-01-29 DIAGNOSIS — N390 Urinary tract infection, site not specified: Secondary | ICD-10-CM

## 2013-01-29 DIAGNOSIS — I129 Hypertensive chronic kidney disease with stage 1 through stage 4 chronic kidney disease, or unspecified chronic kidney disease: Secondary | ICD-10-CM | POA: Insufficient documentation

## 2013-01-29 DIAGNOSIS — Z792 Long term (current) use of antibiotics: Secondary | ICD-10-CM | POA: Insufficient documentation

## 2013-01-29 DIAGNOSIS — Z8679 Personal history of other diseases of the circulatory system: Secondary | ICD-10-CM | POA: Insufficient documentation

## 2013-01-29 DIAGNOSIS — Z87891 Personal history of nicotine dependence: Secondary | ICD-10-CM | POA: Insufficient documentation

## 2013-01-29 DIAGNOSIS — R197 Diarrhea, unspecified: Secondary | ICD-10-CM | POA: Insufficient documentation

## 2013-01-29 DIAGNOSIS — Z8673 Personal history of transient ischemic attack (TIA), and cerebral infarction without residual deficits: Secondary | ICD-10-CM | POA: Insufficient documentation

## 2013-01-29 LAB — COMPREHENSIVE METABOLIC PANEL
Alkaline Phosphatase: 76 U/L (ref 39–117)
BUN: 51 mg/dL — ABNORMAL HIGH (ref 6–23)
CO2: 22 mEq/L (ref 19–32)
GFR calc Af Amer: 21 mL/min — ABNORMAL LOW (ref >90)
GFR calc non Af Amer: 18 mL/min — ABNORMAL LOW (ref >90)
Glucose, Bld: 146 mg/dL — ABNORMAL HIGH (ref 70–99)
Potassium: 3.7 mEq/L (ref 3.5–5.1)
Total Bilirubin: 0.3 mg/dL (ref 0.3–1.2)
Total Protein: 6.5 g/dL (ref 6.0–8.3)

## 2013-01-29 LAB — URINALYSIS, ROUTINE W REFLEX MICROSCOPIC
Ketones, ur: NEGATIVE mg/dL
Nitrite: NEGATIVE
Protein, ur: 100 mg/dL — AB
Urobilinogen, UA: 0.2 mg/dL (ref 0.0–1.0)
pH: 5.5 (ref 5.0–8.0)

## 2013-01-29 LAB — CBC
HCT: 29.5 % — ABNORMAL LOW (ref 36.0–46.0)
Hemoglobin: 10.2 g/dL — ABNORMAL LOW (ref 12.0–15.0)
MCHC: 34.6 g/dL (ref 30.0–36.0)

## 2013-01-29 LAB — URINE MICROSCOPIC-ADD ON

## 2013-01-29 MED ORDER — CEPHALEXIN 500 MG PO CAPS: 500.0000 mg | ORAL_CAPSULE | Freq: Four times a day (QID) | ORAL | Status: DC

## 2013-01-29 MED ORDER — CEFTRIAXONE SODIUM 1 G IJ SOLR
1.0000 g | Freq: Once | INTRAMUSCULAR | Status: AC
  Administered 2013-01-29: 1 g via INTRAMUSCULAR
  Filled 2013-01-29: qty 10

## 2013-01-29 MED ORDER — LIDOCAINE HCL (PF) 1 % IJ SOLN
INTRAMUSCULAR | Status: AC
  Administered 2013-01-29: 2.1 mL
  Filled 2013-01-29: qty 5

## 2013-01-29 MED ORDER — IOHEXOL 300 MG/ML  SOLN
25.0000 mL | INTRAMUSCULAR | Status: AC
  Administered 2013-01-29 (×2): 25 mL via ORAL

## 2013-01-29 MED ORDER — SODIUM CHLORIDE 0.9 % IV BOLUS (SEPSIS)
500.0000 mL | Freq: Once | INTRAVENOUS | Status: AC
  Administered 2013-01-29: 500 mL via INTRAVENOUS

## 2013-01-29 NOTE — ED Provider Notes (Signed)
History     CSN: [id number]  Arrival date & time 01/29/13  1316   First MD Initiated Contact with Patient 01/29/13 1504      Chief Complaint  Patient presents with  . Weakness    (Consider location/radiation/quality/duration/timing/severity/associated sxs/prior treatment) HPI Comments: Patient presents with complaints of not feeling well for the past four days.  She started with n/v/d, feeling weak.  She was seen by her pcp and was given antibiotics for a uti.  She presents here feeling worse, feels weak and as is she is going to pass out.    Patient is a 79 y.o. female presenting with weakness. The history is provided by the patient.  Weakness This is a new problem. Episode onset: 4 days ago. The problem occurs constantly. The problem has been gradually worsening. Associated symptoms include abdominal pain. Nothing aggravates the symptoms. Nothing relieves the symptoms. She has tried nothing for the symptoms. The treatment provided no relief.    Past Medical History  Diagnosis Date  . Hypertension   . Cardiomyopathy   . Abnormal mammogram     awaiting ultrasound guided biopsy  . Chronic renal insufficiency   . TIA (transient ischemic attack)   . LBBB (left bundle branch block)   . Kidney failure     no dialysis    History reviewed. No pertinent past surgical history.  Family History  Problem Relation Age of Onset  . Cancer      History  Substance Use Topics  . Smoking status: Former Games developer  . Smokeless tobacco: Not on file  . Alcohol Use: 0.6 oz/week    1 Glasses of wine per week     Comment: rare    OB History   Grav Para Term Preterm Abortions TAB SAB Ect Mult Living                  Review of Systems  Constitutional: Positive for fever, chills, appetite change and fatigue.  Gastrointestinal: Positive for nausea, vomiting, abdominal pain and diarrhea.  Neurological: Positive for weakness.  All other systems reviewed and are negative.    Allergies   Tape  Home Medications   Current Outpatient Rx  Name  Route  Sig  Dispense  Refill  . acetaminophen (TYLENOL) 325 MG tablet   Oral   Take 325 mg by mouth 2 (two) times daily.         Marland Kitchen allopurinol (ZYLOPRIM) 100 MG tablet   Oral   Take 100 mg by mouth daily.         Marland Kitchen aspirin EC 81 MG tablet   Oral   Take 81 mg by mouth daily.         . carvedilol (COREG) 3.125 MG tablet   Oral   Take 3.125 mg by mouth 2 (two) times daily with a meal.         . ciprofloxacin (CIPRO) 500 MG tablet   Oral   Take 500 mg by mouth daily. Takes for 5 days.         . Coenzyme Q10 (CO Q 10) 100 MG CAPS   Oral   Take 100 mg by mouth daily.         . digoxin (LANOXIN) 0.125 MG tablet   Oral   Take 0.125 mg by mouth daily.         Marland Kitchen docusate sodium (COLACE) 100 MG capsule   Oral   Take 100 mg by mouth 2 (two) times daily.         Marland Kitchen  donepezil (ARICEPT) 10 MG tablet   Oral   Take 10 mg by mouth at bedtime.         . donepezil (ARICEPT) 10 MG tablet   Oral   Take 10 mg by mouth at bedtime.         Marland Kitchen FIBER PO   Oral   Take 1 tablet by mouth daily.         . fish oil-omega-3 fatty acids 1000 MG capsule   Oral   Take 1,000 mg by mouth daily.          . Melatonin 3 MG TABS   Oral   Take 3 mg by mouth at bedtime.         . Multiple Vitamin (MULTIVITAMIN WITH MINERALS) TABS   Oral   Take 1 tablet by mouth daily.         . raloxifene (EVISTA) 60 MG tablet   Oral   Take 60 mg by mouth daily.         Marland Kitchen senna (SENOKOT) 8.6 MG TABS   Oral   Take 1 tablet by mouth daily.         Marland Kitchen telmisartan (MICARDIS) 40 MG tablet   Oral   Take 40 mg by mouth daily.           BP 97/57  Pulse 71  Temp(Src) 98.6 F (37 C) (Oral)  Resp 19  SpO2 97%  Physical Exam  Nursing note and vitals reviewed. Constitutional: She is oriented to person, place, and time. She appears well-developed and well-nourished. No distress.  HENT:  Head: Normocephalic and  atraumatic.  Neck: Normal range of motion. Neck supple.  Cardiovascular: Normal rate and regular rhythm.  Exam reveals no gallop and no friction rub.   No murmur heard. Pulmonary/Chest: Effort normal and breath sounds normal. No respiratory distress. She has no wheezes.  Abdominal: Soft. Bowel sounds are normal. There is no tenderness.  The abdomen is mildly distended with mild ttp in all four quadrants without rebound or guarding.  Musculoskeletal: Normal range of motion.  Neurological: She is alert and oriented to person, place, and time.  Skin: Skin is warm and dry. She is not diaphoretic.    ED Course  Procedures (including critical care time)  Labs Reviewed  CBC - Abnormal; Notable for the following:    WBC 14.6 (*)    RBC 3.22 (*)    Hemoglobin 10.2 (*)    HCT 29.5 (*)    All other components within normal limits  COMPREHENSIVE METABOLIC PANEL - Abnormal; Notable for the following:    Sodium 130 (*)    Glucose, Bld 146 (*)    BUN 51 (*)    Creatinine, Ser 2.28 (*)    Albumin 2.6 (*)    GFR calc non Af Amer 18 (*)    GFR calc Af Amer 21 (*)    All other components within normal limits  GLUCOSE, CAPILLARY - Abnormal; Notable for the following:    Glucose-Capillary 133 (*)    All other components within normal limits  URINALYSIS, ROUTINE W REFLEX MICROSCOPIC   No results found.   No diagnosis found.    MDM  Patient presents here with complaints of abd pain.  Was diagnosed with uti two days ago and given cipro.  She has had some diarrhea and weakness since starting this antibiotic, likely a side effect.  The workup today shows an elevated wbc and continued uti.  The ct of  the abdomen is negative for acute intra-abdominal pathology and does not show colitis.  I will treat with rocephin here and add keflex.  To return prn.        Geoffery Lyons, MD 01/29/13 2012

## 2013-01-29 NOTE — ED Notes (Signed)
Family at bedside. 

## 2013-01-29 NOTE — ED Notes (Signed)
Pt arrives with daughter from home via wheelchair. Dx with UTI Monday and treated with po antibiotics outpt. Pt now weak per daughter, slight confusion noted. Alert, oriented x3. Vss.

## 2013-01-30 LAB — URINE CULTURE

## 2013-07-16 DIAGNOSIS — G629 Polyneuropathy, unspecified: Secondary | ICD-10-CM

## 2013-07-16 DIAGNOSIS — R29898 Other symptoms and signs involving the musculoskeletal system: Secondary | ICD-10-CM

## 2013-07-16 HISTORY — DX: Polyneuropathy, unspecified: G62.9

## 2013-07-16 HISTORY — DX: Other symptoms and signs involving the musculoskeletal system: R29.898

## 2013-08-02 IMAGING — CR DG CHEST 2V
2 series · 2 of 2 positions shown · non-contrast
Comparison: 06/18/2009

CLINICAL DATA: Short of breath.  Weakness.

CHEST - 2 VIEW

[w chest pa]
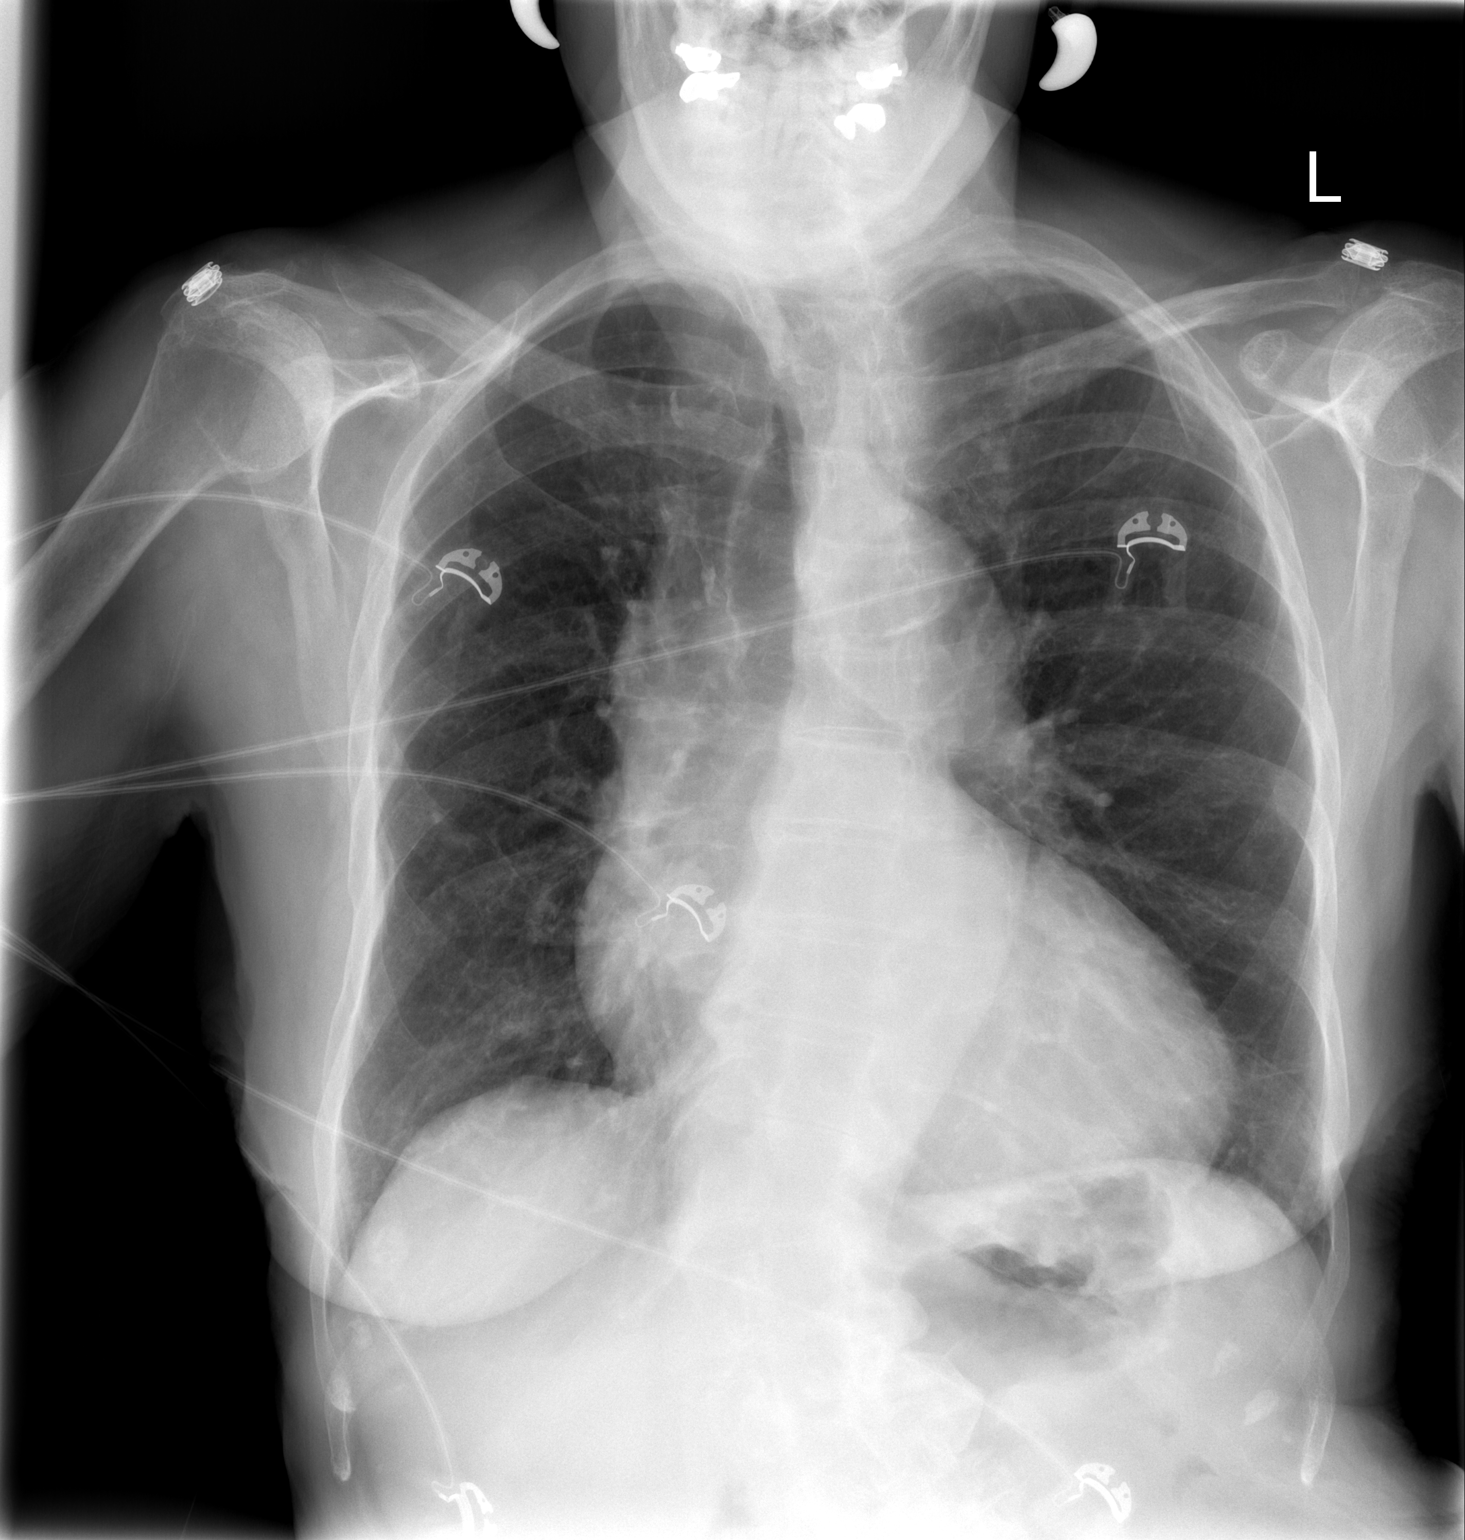

[w chest lat]
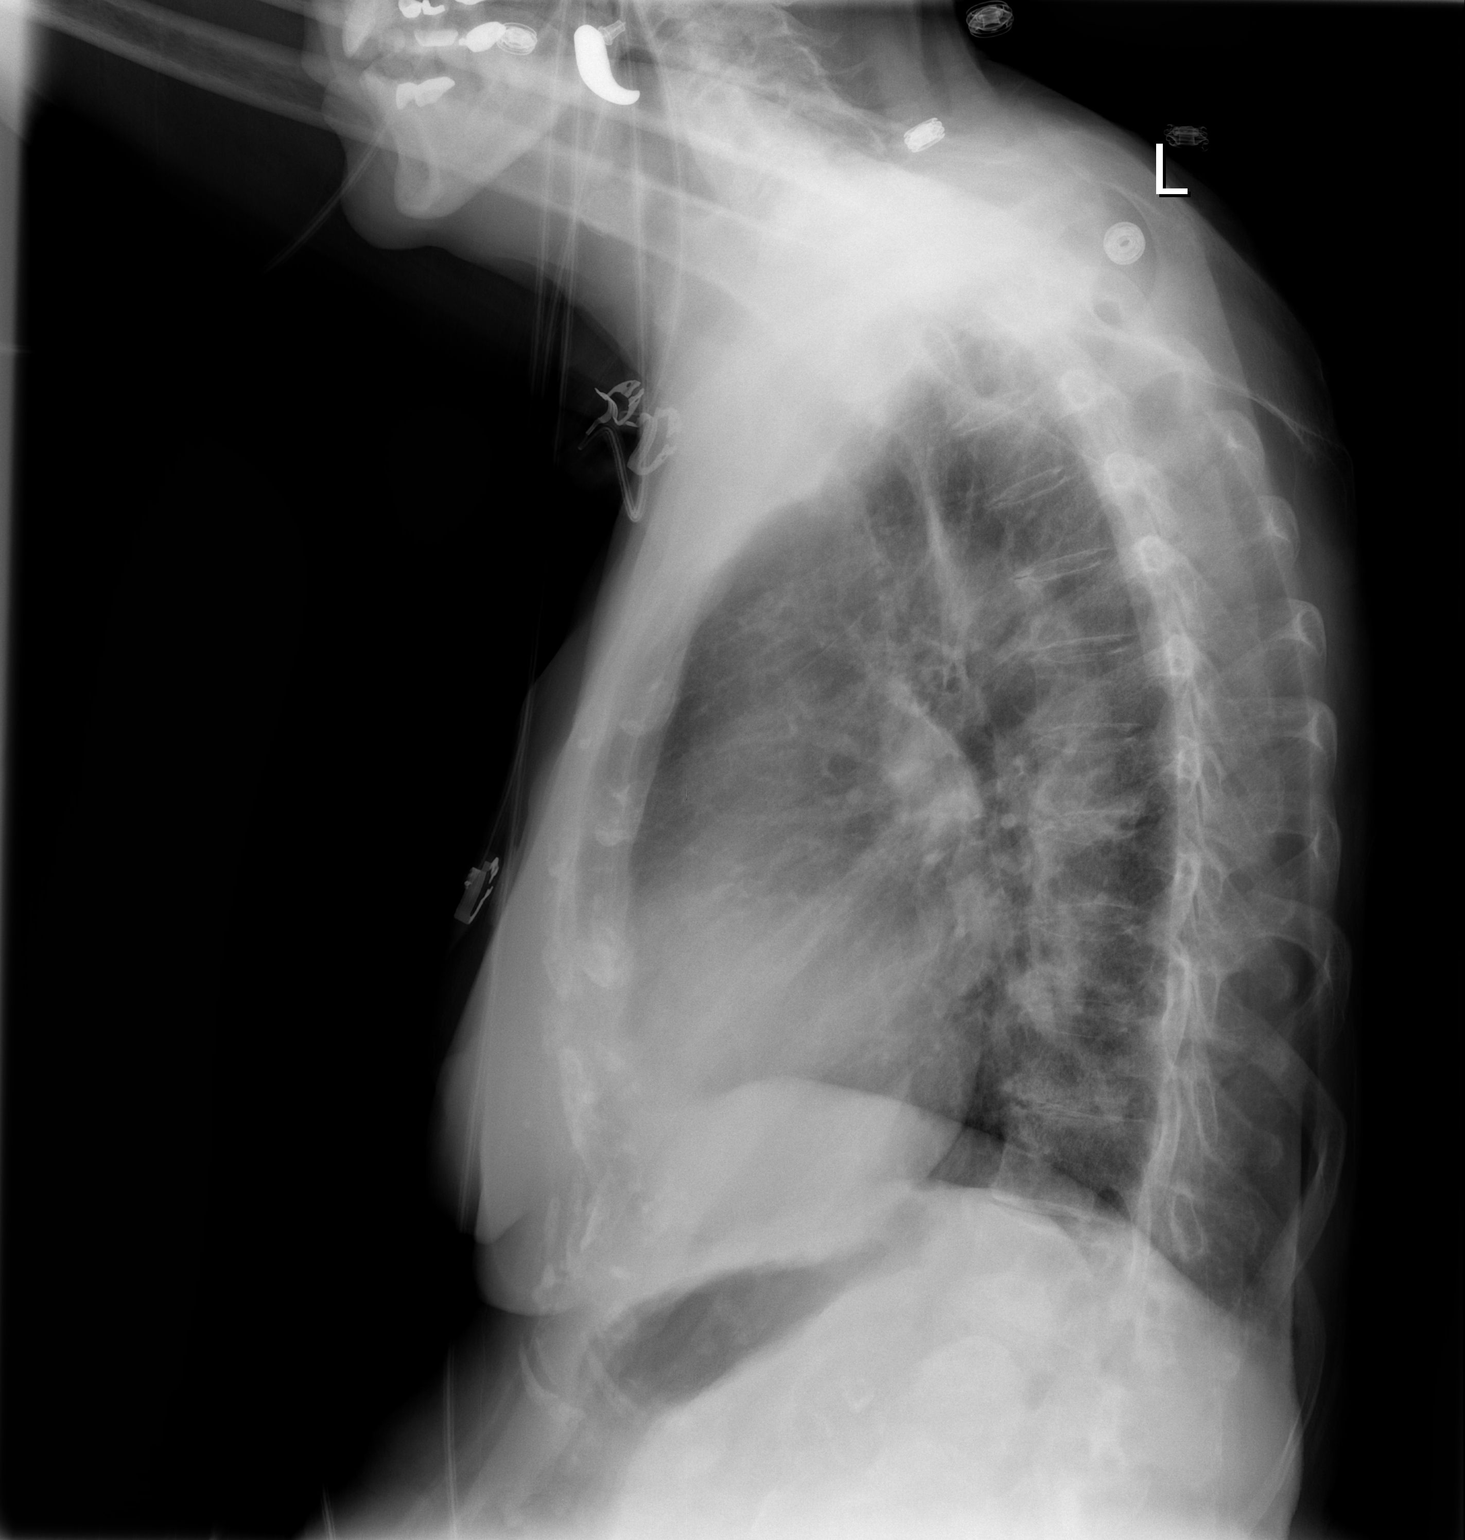

[2 of 2 positions shown; findings below may reference images not displayed]

FINDINGS: The heart is enlarged.  The aorta is unfolded.  There may
be venous hypertension but there is no frank edema.  No effusions.
No infiltrate or collapse.  No significant bony finding.
IMPRESSION: Cardiomegaly.  Possible venous hypertension.  No frank edema.

## 2013-08-14 ENCOUNTER — Encounter: Payer: Self-pay | Admitting: Internal Medicine

## 2013-08-15 ENCOUNTER — Ambulatory Visit (INDEPENDENT_AMBULATORY_CARE_PROVIDER_SITE_OTHER): Payer: Medicare Other | Admitting: Internal Medicine

## 2013-08-15 ENCOUNTER — Encounter: Payer: Self-pay | Admitting: Internal Medicine

## 2013-08-15 VITALS — BP 161/75 | HR 65 | Ht 63.0 in | Wt 114.0 lb

## 2013-08-15 DIAGNOSIS — I43 Cardiomyopathy in diseases classified elsewhere: Secondary | ICD-10-CM

## 2013-08-15 DIAGNOSIS — I447 Left bundle-branch block, unspecified: Secondary | ICD-10-CM

## 2013-08-15 NOTE — Patient Instructions (Signed)
Your physician recommends that you schedule a follow-up appointment as needed.   Your physician recommends that you continue on your current medications as directed. Please refer to the Current Medication list given to you today.  
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
## 2014-05-12 ENCOUNTER — Other Ambulatory Visit: Payer: Self-pay | Admitting: Orthopedic Surgery

## 2014-05-12 NOTE — Progress Notes (Signed)
Preoperative surgical orders have been place into the Epic hospital system for Lynn [name] on 05/12/2014, 5:28 PM  by Patrica Duel for surgery on 05/22/2014.  Preop Hip orders including Experel Injecion, IV Tylenol, and IV Decadron as long as there are no contraindications to the above medications. Avel Peace, PA-C

## 2014-05-14 ENCOUNTER — Encounter (HOSPITAL_COMMUNITY): Payer: Self-pay | Admitting: Pharmacy Technician

## 2014-05-19 ENCOUNTER — Encounter (HOSPITAL_COMMUNITY): Payer: Self-pay

## 2014-05-20 ENCOUNTER — Encounter (HOSPITAL_COMMUNITY): Payer: Self-pay

## 2014-05-20 ENCOUNTER — Ambulatory Visit (HOSPITAL_COMMUNITY)
Admission: RE | Admit: 2014-05-20 | Discharge: 2014-05-20 | Disposition: A | Discharge status: Home / Self Care | Payer: Medicare Other | Source: Ambulatory Visit | Attending: Anesthesiology | Admitting: Anesthesiology

## 2014-05-20 ENCOUNTER — Encounter (HOSPITAL_COMMUNITY)
Admission: RE | Admit: 2014-05-20 | Discharge: 2014-05-20 | Disposition: A | Discharge status: Home / Self Care | Payer: Medicare Other | Source: Ambulatory Visit | Attending: Orthopedic Surgery | Admitting: Orthopedic Surgery

## 2014-05-20 DIAGNOSIS — I517 Cardiomegaly: Secondary | ICD-10-CM | POA: Insufficient documentation

## 2014-05-20 DIAGNOSIS — Z01818 Encounter for other preprocedural examination: Secondary | ICD-10-CM | POA: Insufficient documentation

## 2014-05-20 HISTORY — DX: Adverse effect of unspecified anesthetic, initial encounter: T41.45XA

## 2014-05-20 HISTORY — DX: Personal history of other medical treatment: Z92.89

## 2014-05-20 HISTORY — DX: Other complications of anesthesia, initial encounter: T88.59XA

## 2014-05-20 HISTORY — DX: Other constipation: K59.09

## 2014-05-20 LAB — BASIC METABOLIC PANEL
ANION GAP: 12 (ref 5–15)
BUN: 38 mg/dL — ABNORMAL HIGH (ref 6–23)
CALCIUM: 9.6 mg/dL (ref 8.4–10.5)
CO2: 24 mEq/L (ref 19–32)
CREATININE: 1.91 mg/dL — AB (ref 0.50–1.10)
Chloride: 103 mEq/L (ref 96–112)
GFR calc non Af Amer: 22 mL/min — ABNORMAL LOW (ref >90)
GFR, EST AFRICAN AMERICAN: 26 mL/min — AB (ref >90)
Glucose, Bld: 86 mg/dL (ref 70–99)
Potassium: 4.6 mEq/L (ref 3.7–5.3)
SODIUM: 139 meq/L (ref 137–147)

## 2014-05-20 LAB — CBC
HCT: 34.7 % — ABNORMAL LOW (ref 36.0–46.0)
Hemoglobin: 11.4 g/dL — ABNORMAL LOW (ref 12.0–15.0)
MCH: 30.7 pg (ref 26.0–34.0)
MCHC: 32.9 g/dL (ref 30.0–36.0)
MCV: 93.5 fL (ref 78.0–100.0)
PLATELETS: 287 10*3/uL (ref 150–400)
RBC: 3.71 MIL/uL — AB (ref 3.87–5.11)
RDW: 13.2 % (ref 11.5–15.5)
WBC: 5.2 10*3/uL (ref 4.0–10.5)

## 2014-05-20 NOTE — Pre-Procedure Instructions (Signed)
05-20-14 EKG 5'15 with chart/ Echo 11'14 reports with chart.

## 2014-05-20 NOTE — Patient Instructions (Addendum)
20 Lynn [name]  05/20/2014   Your procedure is scheduled on:   05-22-2014 Friday  Enter through [facility] and follow signs to [facility]. Arrive at      1200 NOON.  Call this number if you have problems the morning of surgery: [phone]  Or Presurgical Testing [phone].   For Living Will and/or Health Care Power Attorney Forms: please provide copy for your medical record,may bring AM of surgery(Forms should be already notarized -we do not provide this service).((05-20-14 Yes, please bring copy of Living Will AM of 05-22-14) to place with medical record..    Do not eat food/ or drink: After Midnight.  Exception: may have clear liquids:up to 6 Hours before arrival. Nothing after: 0800 AM  Clear liquids include soda, tea, black coffee, apple or grape juice, broth.  Take these medicines the morning of surgery with A SIP OF WATER: Digoxin. Donezepil. Use Flonase as needed.   Do not wear jewelry, make-up or nail polish.  Do not wear lotions, powders, or perfumes. You may not  wear deodorant.  Do not shave 48 hours(2 days) prior to first CHG shower(legs and under arms).(Shaving face and neck okay.)  Do not bring valuables to the hospital.(Hospital is not responsible for lost valuables).  Contacts, dentures or removable bridgework, body piercing, hair pins may not be worn into surgery.  Leave suitcase in the car. After surgery it may be brought to your room.  For patients admitted to the hospital, checkout time is 11:00 AM the day of discharge.(Restricted visitors-Any Persons displaying flu-like symptoms or illness).    Patients discharged the day of surgery will not be allowed to drive home. Must have responsible person with you x 24 hours once discharged.  Name and phone number of your driver: Lynn [name] [phone] home  Special Instructions: CHG(Chlorhedine 4%-"Hibiclens","Betasept","Aplicare") Shower Use Special Wash: see special instructions.(avoid face  and genitals)   Please read over the following fact sheets that you were given: Incentive Spirometry Instruction.     _____________________    [facility] - Preparing for Surgery Before surgery, you can play an important role.  Because skin is not sterile, your skin needs to be as free of germs as possible.  You can reduce the number of germs on your skin by washing with CHG (chlorahexidine gluconate) soap before surgery.  CHG is an antiseptic cleaner which kills germs and bonds with the skin to continue killing germs even after washing. Please DO NOT use if you have an allergy to CHG or antibacterial soaps.  If your skin becomes reddened/irritated stop using the CHG and inform your nurse when you arrive at Short Stay. Do not shave (including legs and underarms) for at least 48 hours prior to the first CHG shower.  You may shave your face/neck. Please follow these instructions carefully:  1.  Shower with CHG Soap the night before surgery and the  morning of Surgery.  2.  If you choose to wash your hair, wash your hair first as usual with your  normal  shampoo.  3.  After you shampoo, rinse your hair and body thoroughly to remove the  shampoo.                           4.  Use CHG as you would any other liquid soap.  You can apply chg directly  to the skin and wash  Gently with a scrungie or clean washcloth.  5.  Apply the CHG Soap to your body ONLY FROM THE NECK DOWN.   Do not use on face/ open                           Wound or open sores. Avoid contact with eyes, ears mouth and genitals (private parts).                       Wash face,  Genitals (private parts) with your normal soap.             6.  Wash thoroughly, paying special attention to the area where your surgery  will be performed.  7.  Thoroughly rinse your body with warm water from the neck down.  8.  DO NOT shower/wash with your normal soap after using and rinsing off  the CHG Soap.                9.  Pat  yourself dry with a clean towel.            10.  Wear clean pajamas.            11.  Place clean sheets on your bed the night of your first shower and do not  sleep with pets. Day of Surgery : Do not apply any lotions/deodorants the morning of surgery.  Please wear clean clothes to the hospital/surgery center.  FAILURE TO FOLLOW THESE INSTRUCTIONS MAY RESULT IN THE CANCELLATION OF YOUR SURGERY PATIENT SIGNATURE_________________________________  NURSE SIGNATURE__________________________________  ________________________________________________________________________   Lynn [name]  An incentive spirometer is a tool that can help keep your lungs clear and active. This tool measures how well you are filling your lungs with each breath. Taking long deep breaths may help reverse or decrease the chance of developing breathing (pulmonary) problems (especially infection) following:  A long period of time when you are unable to move or be active. BEFORE THE PROCEDURE   If the spirometer includes an indicator to show your best effort, your nurse or respiratory therapist will set it to a desired goal.  If possible, sit up straight or lean slightly forward. Try not to slouch.  Hold the incentive spirometer in an upright position. INSTRUCTIONS FOR USE  1. Sit on the edge of your bed if possible, or sit up as far as you can in bed or on a chair. 2. Hold the incentive spirometer in an upright position. 3. Breathe out normally. 4. Place the mouthpiece in your mouth and seal your lips tightly around it. 5. Breathe in slowly and as deeply as possible, raising the piston or the ball toward the top of the column. 6. Hold your breath for 3-5 seconds or for as long as possible. Allow the piston or ball to fall to the bottom of the column. 7. Remove the mouthpiece from your mouth and breathe out normally. 8. Rest for a few seconds and repeat Steps 1 through 7 at least 10 times every 1-2 hours  when you are awake. Take your time and take a few normal breaths between deep breaths. 9. The spirometer may include an indicator to show your best effort. Use the indicator as a goal to work toward during each repetition. 10. After each set of 10 deep breaths, practice coughing to be sure your lungs are clear. If you have an incision (the cut made at the time of  surgery), support your incision when coughing by placing a pillow or rolled up towels firmly against it. Once you are able to get out of bed, walk around indoors and cough well. You may stop using the incentive spirometer when instructed by your caregiver.  RISKS AND COMPLICATIONS  Take your time so you do not get dizzy or light-headed.  If you are in pain, you may need to take or ask for pain medication before doing incentive spirometry. It is harder to take a deep breath if you are having pain. AFTER USE  Rest and breathe slowly and easily.  It can be helpful to keep track of a log of your progress. Your caregiver can provide you with a simple table to help with this. If you are using the spirometer at home, follow these instructions: [place] IF:   You are having difficultly using the spirometer.  You have trouble using the spirometer as often as instructed.  Your pain medication is not giving enough relief while using the spirometer.  You develop fever of 100.5 F (38.1 C) or higher. SEEK IMMEDIATE MEDICAL CARE IF:   You cough up bloody sputum that had not been present before.  You develop fever of 102 F (38.9 C) or greater.  You develop worsening pain at or near the incision site. MAKE SURE YOU:   Understand these instructions.  Will watch your condition.  Will get help right away if you are not doing well or get worse. Document Released: 12/25/2006 Document Revised: 11/06/2011 Document Reviewed: 02/25/2007 [facility] Patient Information 2014 [place],  [place].   ________________________________________________________________________

## 2014-05-20 NOTE — Pre-Procedure Instructions (Addendum)
05-20-14 1715 Labs viewable in Epic, known hx. Of renal disease. Note faxed to Dr. Deri Fuelling office [phone].

## 2014-05-22 ENCOUNTER — Observation Stay (HOSPITAL_COMMUNITY)
Admission: RE | Admit: 2014-05-22 | Discharge: 2014-05-23 | Disposition: A | Discharge status: Home / Self Care | Payer: Medicare Other | Source: Ambulatory Visit | Attending: Orthopedic Surgery | Admitting: Orthopedic Surgery

## 2014-05-22 ENCOUNTER — Encounter (HOSPITAL_COMMUNITY): Payer: Self-pay | Admitting: *Deleted

## 2014-05-22 ENCOUNTER — Ambulatory Visit (HOSPITAL_COMMUNITY): Payer: Medicare Other | Admitting: *Deleted

## 2014-05-22 ENCOUNTER — Encounter (HOSPITAL_COMMUNITY)
Admission: RE | Disposition: A | Discharge status: Home / Self Care | Payer: Self-pay | Source: Ambulatory Visit | Attending: Orthopedic Surgery

## 2014-05-22 ENCOUNTER — Encounter (HOSPITAL_COMMUNITY): Payer: Medicare Other | Admitting: *Deleted

## 2014-05-22 DIAGNOSIS — Z79899 Other long term (current) drug therapy: Secondary | ICD-10-CM | POA: Diagnosis not present

## 2014-05-22 DIAGNOSIS — I447 Left bundle-branch block, unspecified: Secondary | ICD-10-CM | POA: Insufficient documentation

## 2014-05-22 DIAGNOSIS — M169 Osteoarthritis of hip, unspecified: Secondary | ICD-10-CM | POA: Insufficient documentation

## 2014-05-22 DIAGNOSIS — N189 Chronic kidney disease, unspecified: Secondary | ICD-10-CM | POA: Diagnosis not present

## 2014-05-22 DIAGNOSIS — M1610 Unilateral primary osteoarthritis, unspecified hip: Secondary | ICD-10-CM | POA: Diagnosis not present

## 2014-05-22 DIAGNOSIS — M7070 Other bursitis of hip, unspecified hip: Secondary | ICD-10-CM | POA: Diagnosis present

## 2014-05-22 DIAGNOSIS — M6688 Spontaneous rupture of other tendons, other: Secondary | ICD-10-CM | POA: Diagnosis not present

## 2014-05-22 DIAGNOSIS — I4949 Other premature depolarization: Secondary | ICD-10-CM | POA: Insufficient documentation

## 2014-05-22 DIAGNOSIS — E785 Hyperlipidemia, unspecified: Secondary | ICD-10-CM | POA: Diagnosis not present

## 2014-05-22 DIAGNOSIS — M76899 Other specified enthesopathies of unspecified lower limb, excluding foot: Principal | ICD-10-CM | POA: Insufficient documentation

## 2014-05-22 DIAGNOSIS — I129 Hypertensive chronic kidney disease with stage 1 through stage 4 chronic kidney disease, or unspecified chronic kidney disease: Secondary | ICD-10-CM | POA: Diagnosis not present

## 2014-05-22 DIAGNOSIS — Z8673 Personal history of transient ischemic attack (TIA), and cerebral infarction without residual deficits: Secondary | ICD-10-CM | POA: Diagnosis not present

## 2014-05-22 DIAGNOSIS — I428 Other cardiomyopathies: Secondary | ICD-10-CM | POA: Diagnosis not present

## 2014-05-22 DIAGNOSIS — K5909 Other constipation: Secondary | ICD-10-CM | POA: Insufficient documentation

## 2014-05-22 DIAGNOSIS — M7071 Other bursitis of hip, right hip: Secondary | ICD-10-CM | POA: Diagnosis present

## 2014-05-22 HISTORY — PX: EXCISION/RELEASE BURSA HIP: SHX5014

## 2014-05-22 SURGERY — RELEASE, BURSA, TROCHANTERIC
Anesthesia: Spinal | Site: Hip | Laterality: Right

## 2014-05-22 MED ORDER — CEFAZOLIN SODIUM-DEXTROSE 2-3 GM-% IV SOLR
INTRAVENOUS | Status: AC
  Filled 2014-05-22: qty 50

## 2014-05-22 MED ORDER — CEFAZOLIN SODIUM-DEXTROSE 2-3 GM-% IV SOLR
2.0000 g | INTRAVENOUS | Status: AC
  Administered 2014-05-22: 2 g via INTRAVENOUS

## 2014-05-22 MED ORDER — MEPERIDINE HCL 50 MG/ML IJ SOLN: 6.2500 mg | INTRAMUSCULAR | Status: DC | PRN

## 2014-05-22 MED ORDER — LACTATED RINGERS IV SOLN
INTRAVENOUS | Status: DC | PRN
  Administered 2014-05-22 (×2): via INTRAVENOUS

## 2014-05-22 MED ORDER — CEPHALEXIN 500 MG PO CAPS: 500.0000 mg | ORAL_CAPSULE | Freq: Every morning | ORAL | Status: DC

## 2014-05-22 MED ORDER — HYDROCODONE-ACETAMINOPHEN 5-325 MG PO TABS
1.0000 | ORAL_TABLET | ORAL | Status: DC | PRN
  Administered 2014-05-22 – 2014-05-23 (×5): 1 via ORAL
  Filled 2014-05-22 (×6): qty 1

## 2014-05-22 MED ORDER — CEFAZOLIN SODIUM-DEXTROSE 2-3 GM-% IV SOLR
2.0000 g | Freq: Four times a day (QID) | INTRAVENOUS | Status: AC
  Administered 2014-05-22 – 2014-05-23 (×3): 2 g via INTRAVENOUS
  Filled 2014-05-22 (×3): qty 50

## 2014-05-22 MED ORDER — PROMETHAZINE HCL 25 MG/ML IJ SOLN: 6.2500 mg | INTRAMUSCULAR | Status: DC | PRN

## 2014-05-22 MED ORDER — ONDANSETRON HCL 4 MG/2ML IJ SOLN
4.0000 mg | Freq: Four times a day (QID) | INTRAMUSCULAR | Status: DC | PRN
Start: 2014-05-22 — End: 2014-05-23

## 2014-05-22 MED ORDER — DEXAMETHASONE SODIUM PHOSPHATE 10 MG/ML IJ SOLN: 10.0000 mg | Freq: Once | INTRAMUSCULAR | Status: DC

## 2014-05-22 MED ORDER — PROPOFOL 10 MG/ML IV BOLUS
INTRAVENOUS | Status: AC
  Filled 2014-05-22: qty 20

## 2014-05-22 MED ORDER — METOCLOPRAMIDE HCL 10 MG PO TABS: 5.0000 mg | ORAL_TABLET | Freq: Three times a day (TID) | ORAL | Status: DC | PRN

## 2014-05-22 MED ORDER — MORPHINE SULFATE 2 MG/ML IJ SOLN
1.0000 mg | INTRAMUSCULAR | Status: DC | PRN
  Administered 2014-05-23: 1 mg via INTRAVENOUS
  Filled 2014-05-22: qty 1

## 2014-05-22 MED ORDER — FENTANYL CITRATE 0.05 MG/ML IJ SOLN
INTRAMUSCULAR | Status: AC
  Filled 2014-05-22: qty 5

## 2014-05-22 MED ORDER — CEFAZOLIN SODIUM-DEXTROSE 2-3 GM-% IV SOLR: 2.0000 g | INTRAVENOUS | Status: DC

## 2014-05-22 MED ORDER — BUPIVACAINE LIPOSOME 1.3 % IJ SUSP
20.0000 mL | Freq: Once | INTRAMUSCULAR | Status: DC
  Filled 2014-05-22: qty 20

## 2014-05-22 MED ORDER — TRAMADOL HCL 50 MG PO TABS: 50.0000 mg | ORAL_TABLET | Freq: Four times a day (QID) | ORAL | Status: DC | PRN

## 2014-05-22 MED ORDER — CHLORHEXIDINE GLUCONATE 4 % EX LIQD: 60.0000 mL | Freq: Once | CUTANEOUS | Status: DC

## 2014-05-22 MED ORDER — DIGOXIN 125 MCG PO TABS
0.1250 mg | ORAL_TABLET | ORAL | Status: DC
  Administered 2014-05-23: 0.125 mg via ORAL
  Filled 2014-05-22: qty 1

## 2014-05-22 MED ORDER — PROPOFOL INFUSION 10 MG/ML OPTIME
INTRAVENOUS | Status: DC | PRN
  Administered 2014-05-22: 100 ug/kg/min via INTRAVENOUS

## 2014-05-22 MED ORDER — ONDANSETRON HCL 4 MG PO TABS: 4.0000 mg | ORAL_TABLET | Freq: Four times a day (QID) | ORAL | Status: DC | PRN

## 2014-05-22 MED ORDER — METHOCARBAMOL 1000 MG/10ML IJ SOLN
500.0000 mg | Freq: Four times a day (QID) | INTRAVENOUS | Status: DC | PRN
  Filled 2014-05-22: qty 5

## 2014-05-22 MED ORDER — METOCLOPRAMIDE HCL 5 MG/ML IJ SOLN: 2.5000 mg | Freq: Three times a day (TID) | INTRAMUSCULAR | Status: DC | PRN

## 2014-05-22 MED ORDER — RALOXIFENE HCL 60 MG PO TABS
60.0000 mg | ORAL_TABLET | Freq: Every day | ORAL | Status: DC
  Administered 2014-05-23: 60 mg via ORAL
  Filled 2014-05-22: qty 1

## 2014-05-22 MED ORDER — SODIUM CHLORIDE 0.9 % IV SOLN: INTRAVENOUS | Status: DC

## 2014-05-22 MED ORDER — IRBESARTAN 150 MG PO TABS
150.0000 mg | ORAL_TABLET | Freq: Every day | ORAL | Status: DC
  Administered 2014-05-22: 150 mg via ORAL
  Filled 2014-05-22 (×2): qty 1

## 2014-05-22 MED ORDER — ACETAMINOPHEN 10 MG/ML IV SOLN
1000.0000 mg | Freq: Once | INTRAVENOUS | Status: AC
  Administered 2014-05-22: 1000 mg via INTRAVENOUS
  Filled 2014-05-22: qty 100

## 2014-05-22 MED ORDER — SODIUM CHLORIDE 0.9 % IJ SOLN
INTRAMUSCULAR | Status: DC | PRN
  Administered 2014-05-22: 30 mL

## 2014-05-22 MED ORDER — ONDANSETRON HCL 4 MG/2ML IJ SOLN
INTRAMUSCULAR | Status: DC | PRN
  Administered 2014-05-22: 4 mg via INTRAVENOUS

## 2014-05-22 MED ORDER — BUPIVACAINE LIPOSOME 1.3 % IJ SUSP
INTRAMUSCULAR | Status: DC | PRN
  Administered 2014-05-22: 20 mL

## 2014-05-22 MED ORDER — BUPIVACAINE HCL (PF) 0.25 % IJ SOLN
INTRAMUSCULAR | Status: AC
  Filled 2014-05-22: qty 30

## 2014-05-22 MED ORDER — ONDANSETRON HCL 4 MG/2ML IJ SOLN
INTRAMUSCULAR | Status: AC
  Filled 2014-05-22: qty 2

## 2014-05-22 MED ORDER — ENOXAPARIN SODIUM 30 MG/0.3ML ~~LOC~~ SOLN
30.0000 mg | SUBCUTANEOUS | Status: DC
  Administered 2014-05-23: 30 mg via SUBCUTANEOUS
  Filled 2014-05-22 (×2): qty 0.3

## 2014-05-22 MED ORDER — DEXAMETHASONE SODIUM PHOSPHATE 10 MG/ML IJ SOLN
INTRAMUSCULAR | Status: AC
  Filled 2014-05-22: qty 1

## 2014-05-22 MED ORDER — KETAMINE HCL 10 MG/ML IJ SOLN
INTRAMUSCULAR | Status: DC | PRN
  Administered 2014-05-22: 10 mg via INTRAVENOUS
  Administered 2014-05-22: 20 mg via INTRAVENOUS

## 2014-05-22 MED ORDER — BUPIVACAINE HCL 0.25 % IJ SOLN
INTRAMUSCULAR | Status: DC | PRN
  Administered 2014-05-22: 20 mL

## 2014-05-22 MED ORDER — FLUTICASONE PROPIONATE 50 MCG/ACT NA SUSP
1.0000 | Freq: Every day | NASAL | Status: DC | PRN
  Filled 2014-05-22: qty 16

## 2014-05-22 MED ORDER — METHOCARBAMOL 500 MG PO TABS: 500.0000 mg | ORAL_TABLET | Freq: Four times a day (QID) | ORAL | Status: DC | PRN

## 2014-05-22 MED ORDER — FENTANYL CITRATE 0.05 MG/ML IJ SOLN: 25.0000 ug | INTRAMUSCULAR | Status: DC | PRN

## 2014-05-22 MED ORDER — DONEPEZIL HCL 10 MG PO TABS
10.0000 mg | ORAL_TABLET | Freq: Every day | ORAL | Status: DC
  Administered 2014-05-22: 10 mg via ORAL
  Filled 2014-05-22 (×2): qty 1

## 2014-05-22 MED ORDER — METOCLOPRAMIDE HCL 5 MG/ML IJ SOLN
INTRAMUSCULAR | Status: DC | PRN
  Administered 2014-05-22: 10 mg via INTRAVENOUS

## 2014-05-22 MED ORDER — SODIUM CHLORIDE 0.9 % IJ SOLN
INTRAMUSCULAR | Status: AC
  Filled 2014-05-22: qty 50

## 2014-05-22 MED ORDER — DEXAMETHASONE SODIUM PHOSPHATE 10 MG/ML IJ SOLN
INTRAMUSCULAR | Status: DC | PRN
  Administered 2014-05-22: 10 mg via INTRAVENOUS

## 2014-05-22 MED ORDER — ALLOPURINOL 100 MG PO TABS
100.0000 mg | ORAL_TABLET | Freq: Every day | ORAL | Status: DC
  Administered 2014-05-22: 100 mg via ORAL
  Filled 2014-05-22 (×2): qty 1

## 2014-05-22 SURGICAL SUPPLY — 42 items
ANCHOR NEEDLE 9/16 CIR SZ 8 (NEEDLE) ×3 IMPLANT
ANCHOR SUPER QUICK (Anchor) ×6 IMPLANT
BAG ZIPLOCK 12X15 (MISCELLANEOUS) IMPLANT
BIT DRILL 2.4X128 (BIT) IMPLANT
BIT DRILL 2.4X128MM (BIT)
BLADE EXTENDED COATED 6.5IN (ELECTRODE) ×3 IMPLANT
CLOSURE WOUND 1/2 X4 (GAUZE/BANDAGES/DRESSINGS) ×1
DRAPE INCISE IOBAN 66X45 STRL (DRAPES) ×3 IMPLANT
DRAPE POUCH INSTRU U-SHP 10X18 (DRAPES) ×3 IMPLANT
DRAPE SPLIT 77X100IN (DRAPES) ×6 IMPLANT
DRAPE U-SHAPE 47X51 STRL (DRAPES) ×3 IMPLANT
DRSG ADAPTIC 3X8 NADH LF (GAUZE/BANDAGES/DRESSINGS) ×3 IMPLANT
DRSG MEPILEX BORDER 4X4 (GAUZE/BANDAGES/DRESSINGS) ×3 IMPLANT
DRSG MEPILEX BORDER 4X8 (GAUZE/BANDAGES/DRESSINGS) ×3 IMPLANT
DURAPREP 26ML APPLICATOR (WOUND CARE) ×3 IMPLANT
ELECT REM PT RETURN 9FT ADLT (ELECTROSURGICAL) ×3
ELECTRODE REM PT RTRN 9FT ADLT (ELECTROSURGICAL) ×1 IMPLANT
GAUZE SPONGE 4X4 12PLY STRL (GAUZE/BANDAGES/DRESSINGS) IMPLANT
GLOVE BIO SURGEON STRL SZ7.5 (GLOVE) ×3 IMPLANT
GLOVE BIO SURGEON STRL SZ8 (GLOVE) ×3 IMPLANT
GLOVE BIOGEL PI IND STRL 8 (GLOVE) ×2 IMPLANT
GLOVE BIOGEL PI INDICATOR 8 (GLOVE) ×4
GOWN STRL REUS W/TWL LRG LVL3 (GOWN DISPOSABLE) ×3 IMPLANT
GOWN STRL REUS W/TWL XL LVL3 (GOWN DISPOSABLE) ×3 IMPLANT
KIT BASIN OR (CUSTOM PROCEDURE TRAY) ×3 IMPLANT
MANIFOLD NEPTUNE II (INSTRUMENTS) ×3 IMPLANT
NDL SAFETY ECLIPSE 18X1.5 (NEEDLE) ×2 IMPLANT
NEEDLE HYPO 18GX1.5 SHARP (NEEDLE) ×4
NS IRRIG 1000ML POUR BTL (IV SOLUTION) ×3 IMPLANT
PACK TOTAL JOINT (CUSTOM PROCEDURE TRAY) ×3 IMPLANT
PASSER SUT SWANSON 36MM LOOP (INSTRUMENTS) IMPLANT
POSITIONER SURGICAL ARM (MISCELLANEOUS) ×3 IMPLANT
STRIP CLOSURE SKIN 1/2X4 (GAUZE/BANDAGES/DRESSINGS) ×2 IMPLANT
SUT ETHIBOND NAB CT1 #1 30IN (SUTURE) IMPLANT
SUT MNCRL AB 4-0 PS2 18 (SUTURE) ×6 IMPLANT
SUT VIC AB 1 CT1 27 (SUTURE) ×4
SUT VIC AB 1 CT1 27XBRD ANTBC (SUTURE) ×2 IMPLANT
SUT VIC AB 2-0 CT1 27 (SUTURE) ×4
SUT VIC AB 2-0 CT1 TAPERPNT 27 (SUTURE) ×2 IMPLANT
SYR 20CC LL (SYRINGE) ×3 IMPLANT
SYR 50ML LL SCALE MARK (SYRINGE) ×3 IMPLANT
TOWEL OR 17X26 10 PK STRL BLUE (TOWEL DISPOSABLE) ×3 IMPLANT

## 2014-05-22 NOTE — Brief Op Note (Signed)
05/22/2014  2:28 PM  PATIENT:  Lynn [name]  80 y.o. female  PRE-OPERATIVE DIAGNOSIS:  right hip bursitis with gluteal tendon tear  POST-OPERATIVE DIAGNOSIS:  right hip bursitis with gluteal tendon tear  PROCEDURE:  Procedure(s): RIGHT HIP BURSECTOMY WITN GLUTEAL TENDON REPAIR (Right)  SURGEON:  Surgeon(s) and Role:    * Loanne Drilling, MD - Primary  PHYSICIAN ASSISTANT:   ASSISTANTS: none   ANESTHESIA:   spinal  EBL:  Total I/O In: 1000 [I.V.:1000] Out: -   BLOOD ADMINISTERED:none  DRAINS: (Medium) Hemovact drain(s) in the right hip with  Suction Open   LOCAL MEDICATIONS USED:  OTHER Exparel  COUNTS:  YES  TOURNIQUET:  * No tourniquets in log *  DICTATION: .Note written in paper chart and Other Dictation: Dictation Number [id number]  PLAN OF CARE: Admit for overnight observation  PATIENT DISPOSITION:  PACU - hemodynamically stable.

## 2014-05-22 NOTE — Anesthesia Preprocedure Evaluation (Signed)
Anesthesia Evaluation  Patient identified by MRN, date of birth, ID band Patient awake    Reviewed: Allergy & Precautions, H&P , NPO status , Patient's Chart, lab work & pertinent test results  History of Anesthesia Complications Negative for: history of anesthetic complications  Airway Mallampati: II TM Distance: >3 FB Neck ROM: Full    Dental no notable dental hx.    Pulmonary former smoker,  breath sounds clear to auscultation  Pulmonary exam normal       Cardiovascular hypertension, Pt. on medications Rhythm:Regular Rate:Normal  Dilated cardiomyopathy LBBB   Neuro/Psych TIAnegative psych ROS   GI/Hepatic negative GI ROS, Neg liver ROS,   Endo/Other  negative endocrine ROS  Renal/GU negative Renal ROS  negative genitourinary   Musculoskeletal negative musculoskeletal ROS (+)   Abdominal   Peds negative pediatric ROS (+)  Hematology negative hematology ROS (+)   Anesthesia Other Findings   Reproductive/Obstetrics negative OB ROS                           Anesthesia Physical Anesthesia Plan  ASA: III  Anesthesia Plan: Spinal   Post-op Pain Management:    Induction:   Airway Management Planned: Simple Face Mask  Additional Equipment:   Intra-op Plan:   Post-operative Plan:   Informed Consent: I have reviewed the patients History and Physical, chart, labs and discussed the procedure including the risks, benefits and alternatives for the proposed anesthesia with the patient or authorized representative who has indicated his/her understanding and acceptance.   Dental advisory given  Plan Discussed with: CRNA  Anesthesia Plan Comments:         Anesthesia Quick Evaluation

## 2014-05-22 NOTE — Transfer of Care (Signed)
Immediate Anesthesia Transfer of Care Note  Patient: Lynn [name]  Procedure(s) Performed: Procedure(s): RIGHT HIP BURSECTOMY WITN GLUTEAL TENDON REPAIR (Right)  Patient Location: PACU  Anesthesia Type:Spinal  Level of Consciousness: Patient easily awoken, sedated, comfortable, cooperative, following commands, responds to stimulation.   Airway & Oxygen Therapy: Patient spontaneously breathing, ventilating well, oxygen via simple oxygen mask.  Post-op Assessment: Report given to PACU RN, vital signs reviewed and stable, moving all extremities.   Post vital signs: Reviewed and stable.  Complications: No apparent anesthesia complications

## 2014-05-22 NOTE — Anesthesia Postprocedure Evaluation (Signed)
  Anesthesia Post-op Note  Patient: Lynn [name]  Procedure(s) Performed: Procedure(s) (LRB): RIGHT HIP BURSECTOMY WITN GLUTEAL TENDON REPAIR (Right)  Patient Location: PACU  Anesthesia Type: Spinal  Level of Consciousness: awake and alert   Airway and Oxygen Therapy: Patient Spontanous Breathing  Post-op Pain: mild  Post-op Assessment: Post-op Vital signs reviewed, Patient's Cardiovascular Status Stable, Respiratory Function Stable, Patent Airway and No signs of Nausea or vomiting  Last Vitals:  Filed Vitals:   05/22/14 1445  BP: 142/62  Pulse: 70  Temp:   Resp: 18    Post-op Vital Signs: stable   Complications: No apparent anesthesia complications

## 2014-05-22 NOTE — Interval H&P Note (Signed)
History and Physical Interval Note:  05/22/2014 1:19 PM  Lynn [name]  has presented today for surgery, with the diagnosis of right hip bursitis  The various methods of treatment have been discussed with the patient and family. After consideration of risks, benefits and other options for treatment, the patient has consented to  Procedure(s): RIGHT HIP BURSECTOMY WITN GLUTEAL TENDON REPAIR (Right) as a surgical intervention .  The patient's history has been reviewed, patient examined, no change in status, stable for surgery.  I have reviewed the patient's chart and labs.  Questions were answered to the patient's satisfaction.     Loanne Drilling
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
## 2014-05-23 DIAGNOSIS — M76899 Other specified enthesopathies of unspecified lower limb, excluding foot: Secondary | ICD-10-CM | POA: Diagnosis not present

## 2014-05-23 MED ORDER — HYDROCODONE-ACETAMINOPHEN 5-325 MG PO TABS: 1.0000 | ORAL_TABLET | ORAL | Status: AC | PRN

## 2014-05-23 MED ORDER — METHOCARBAMOL 500 MG PO TABS: 500.0000 mg | ORAL_TABLET | Freq: Four times a day (QID) | ORAL | Status: AC | PRN

## 2014-05-23 NOTE — Progress Notes (Signed)
Patient d/c'd to home ([facility]).  Reviewed discharge instructions with patient and daughter, both verbalized understanding.  Prescriptions given to daughter.  Patient has chosen [place] for [facility] services and has all needed equipment at this time.  Patient escorted from floor via w/c with NT.  Dorothyann Peng RN

## 2014-05-23 NOTE — Op Note (Signed)
NAMEKRISTY-LEE, [name] NO.:  [id number]  MEDICAL RECORD NO.:  [id number]  LOCATION:  1611                         FACILITY:  [facility]  PHYSICIAN:  Ollen Gross, M.D.    DATE OF BIRTH:  1925-07-04  DATE OF PROCEDURE:  05/22/2014 DATE OF DISCHARGE:                              OPERATIVE REPORT   PREOPERATIVE DIAGNOSIS:  Right hip intractable bursitis with gluteal tendon tear.  POSTOPERATIVE DIAGNOSIS:  Right hip intractable bursitis with gluteal tendon tear.  PROCEDURE:  Right hip bursectomy with repair of gluteus medius and minimus tendon tears.  SURGEON:  Ollen Gross, M.D.  No assistant.  ANESTHESIA:  Spinal.  ESTIMATED BLOOD LOSS:  Minimal.  DRAINS:  Hemovac x1.  COMPLICATIONS:  None.  CONDITION:  Stable to recovery.  BRIEF CLINICAL NOTE:  [name] is an 80 year old female, who has had a long history of significant right lateral hip pain and now weakness. She had multiple injections in the past and they have not helped recently.  Radiographs were unremarkable.  MRI showed significant tear of gluteus medius tendon.  She presents now for bursectomy and tendon repair.  PROCEDURE IN DETAIL:  After successful administration of spinal anesthetic, the patient was placed in left lateral decubitus position with the right side up and held with the hip positioner.  Right lower extremity was isolated from her perineum with plastic drapes and prepped and draped in the usual sterile fashion.  Short lateral based incision was made centered over the tip of the greater trochanter.  Skin cut with a #10 blade through subcutaneous tissue to the fascia lata which incised in line with the skin incision.  Sciatic nerve was palpated and protected.  Upon incising the fascia lata, it was noted that there was a fair amount of fluid and a very inflamed thickened bursa.  The bursa was excised.  She has a large tear of the gluteus medius and minimus tendons approximately  the posterior half to 2/3 of the tendon were torn.  I placed 2 Mitek anchors into the greater trochanter and then passed the sutures through the torn tendon edges and reapproximated them back to the greater trochanter and then passed the sutures through trough to secure the tendon down.  The sutures were tied and that we had a very secure repair.  The trochanter was then palpated again, no other tears were noted.  The wound was then copiously irrigated with saline solution.  The fascia lata was closed over Hemovac drain with interrupted #1 Vicryl suture.  Small triangular area was left open over the tip of the trochanter to prevent friction.  Subcu was closed with interrupted 2-0 Vicryl and subcuticular running 4-0 Monocryl.  Note that prior to closure, I had injected the gluteal muscles, fascia lata, and subcu tissues with a total of 20 mL of Exparel mixed with 30 mL of saline and then additional 20 mL of 0.25% Marcaine was injected into the same tissues.  After closure, the incisions were cleaned and dried. Steri-Strips and a bulky sterile dressing applied.  She was then awakened and transported to recovery in stable condition.     Ollen Gross, M.D.  FA/MEDQ  D:  05/22/2014  T:  05/23/2014  Job:  [id number]

## 2014-05-23 NOTE — Progress Notes (Signed)
CARE MANAGEMENT NOTE 05/23/2014  Patient:  Lynn [name], Lynn [name]   Account Number:  [id number]  Date Initiated:  05/23/2014  Documentation initiated by:  [facility]  Subjective/Objective Assessment:   RIGHT HIP BURSECTOMY WITN GLUTEAL TENDON REPAIR     Action/Plan:   Anticipated DC Date:  05/23/2014   Anticipated DC Plan:  HOME W HOME HEALTH SERVICES      DC Planning Services  CM consult      [facility] Choice  HOME HEALTH   Choice offered to / List presented to:  C-1 Patient        HH arranged  HH-2 PT      [facility] agency  [facility]   Status of service:  Completed, signed off Medicare Important Message given?  YES (If response is "NO", the following Medicare IM given date fields will be blank) Date Medicare IM given:  05/23/2014 Medicare IM given by:  [facility] Date Additional Medicare IM given:   Additional Medicare IM given by:    Discharge Disposition:  HOME W HOME HEALTH SERVICES  Per UR Regulation:    If discussed at Long Length of Stay Meetings, dates discussed:    Comments:  05/23/2014 1030 NCM spoke to pt and offered choice for [facility]. Pt states she has RW and 3n1 for at home. [facility] preoperatively arranged. Notified [facility] of dc home today. Isidoro Donning RN CCM Case Mgmt phone [phone]

## 2014-05-23 NOTE — Progress Notes (Signed)
Subjective: 1 Day Post-Op Procedure(s) (LRB): RIGHT HIP BURSECTOMY WITN GLUTEAL TENDON REPAIR (Right) Patient reports pain as mild.    Objective: Vital signs in last 24 hours: Temp:  [97.3 F (36.3 C)-98.4 F (36.9 C)] 97.6 F (36.4 C) (09/26 0606) Pulse Rate:  [59-70] 61 (09/26 0606) Resp:  [14-18] 16 (09/26 0606) BP: (127-160)/(50-65) 156/64 mmHg (09/26 0606) SpO2:  [98 %-100 %] 100 % (09/26 0606) Weight:  [116 lb 6 oz (52.787 kg)] 116 lb 6 oz (52.787 kg) (09/25 1235)  Intake/Output from previous day: 09/25 0701 - 09/26 0700 In: 1750 [P.O.:360; I.V.:1290; IV Piggyback:100] Out: 1501 [Urine:1500; Stool:1] Intake/Output this shift: Total I/O In: -  Out: 300 [Urine:300]   Recent Labs  05/20/14 1030  HGB 11.4*    Recent Labs  05/20/14 1030  WBC 5.2  RBC 3.71*  HCT 34.7*  PLT 287    Recent Labs  05/20/14 1030  NA 139  K 4.6  CL 103  CO2 24  BUN 38*  CREATININE 1.91*  GLUCOSE 86  CALCIUM 9.6   No results found for this basename: LABPT, INR,  in the last 72 hours  Neurologically intact Neurovascular intact No cellulitis present Compartment soft  Assessment/Plan: 1 Day Post-Op Procedure(s) (LRB): RIGHT HIP BURSECTOMY WITN GLUTEAL TENDON REPAIR (Right) Advance diet Up with therapy D/C IV fluids Discharge home with home health  Tieisha Darden V 05/23/2014, 8:39 AM

## 2014-05-23 NOTE — Progress Notes (Signed)
UR completed 

## 2014-05-25 ENCOUNTER — Encounter (HOSPITAL_COMMUNITY): Payer: Self-pay | Admitting: Orthopedic Surgery
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
## 2014-06-11 ENCOUNTER — Other Ambulatory Visit: Payer: Self-pay | Admitting: Orthopedic Surgery

## 2014-08-06 ENCOUNTER — Encounter (HOSPITAL_COMMUNITY): Payer: Self-pay | Admitting: Cardiology

## 2014-12-30 ENCOUNTER — Other Ambulatory Visit: Payer: Self-pay | Admitting: Dermatology

## 2015-06-03 ENCOUNTER — Other Ambulatory Visit: Payer: Self-pay | Admitting: Geriatric Medicine

## 2015-06-03 DIAGNOSIS — G459 Transient cerebral ischemic attack, unspecified: Secondary | ICD-10-CM

## 2015-06-04 ENCOUNTER — Other Ambulatory Visit: Payer: Self-pay | Admitting: Geriatric Medicine

## 2015-06-04 DIAGNOSIS — M549 Dorsalgia, unspecified: Secondary | ICD-10-CM

## 2015-06-09 ENCOUNTER — Ambulatory Visit
Admission: RE | Admit: 2015-06-09 | Discharge: 2015-06-09 | Disposition: A | Discharge status: Home / Self Care | Payer: Medicare Other | Source: Ambulatory Visit | Attending: Geriatric Medicine | Admitting: Geriatric Medicine

## 2015-06-09 DIAGNOSIS — M549 Dorsalgia, unspecified: Secondary | ICD-10-CM

## 2015-10-16 IMAGING — CR DG CHEST 2V
2 series · 2 of 2 positions shown · non-contrast
Comparison: None.

CLINICAL DATA: Preop chest x-ray right hip surgery.

EXAM:
CHEST  2 VIEW

[w chest pa]
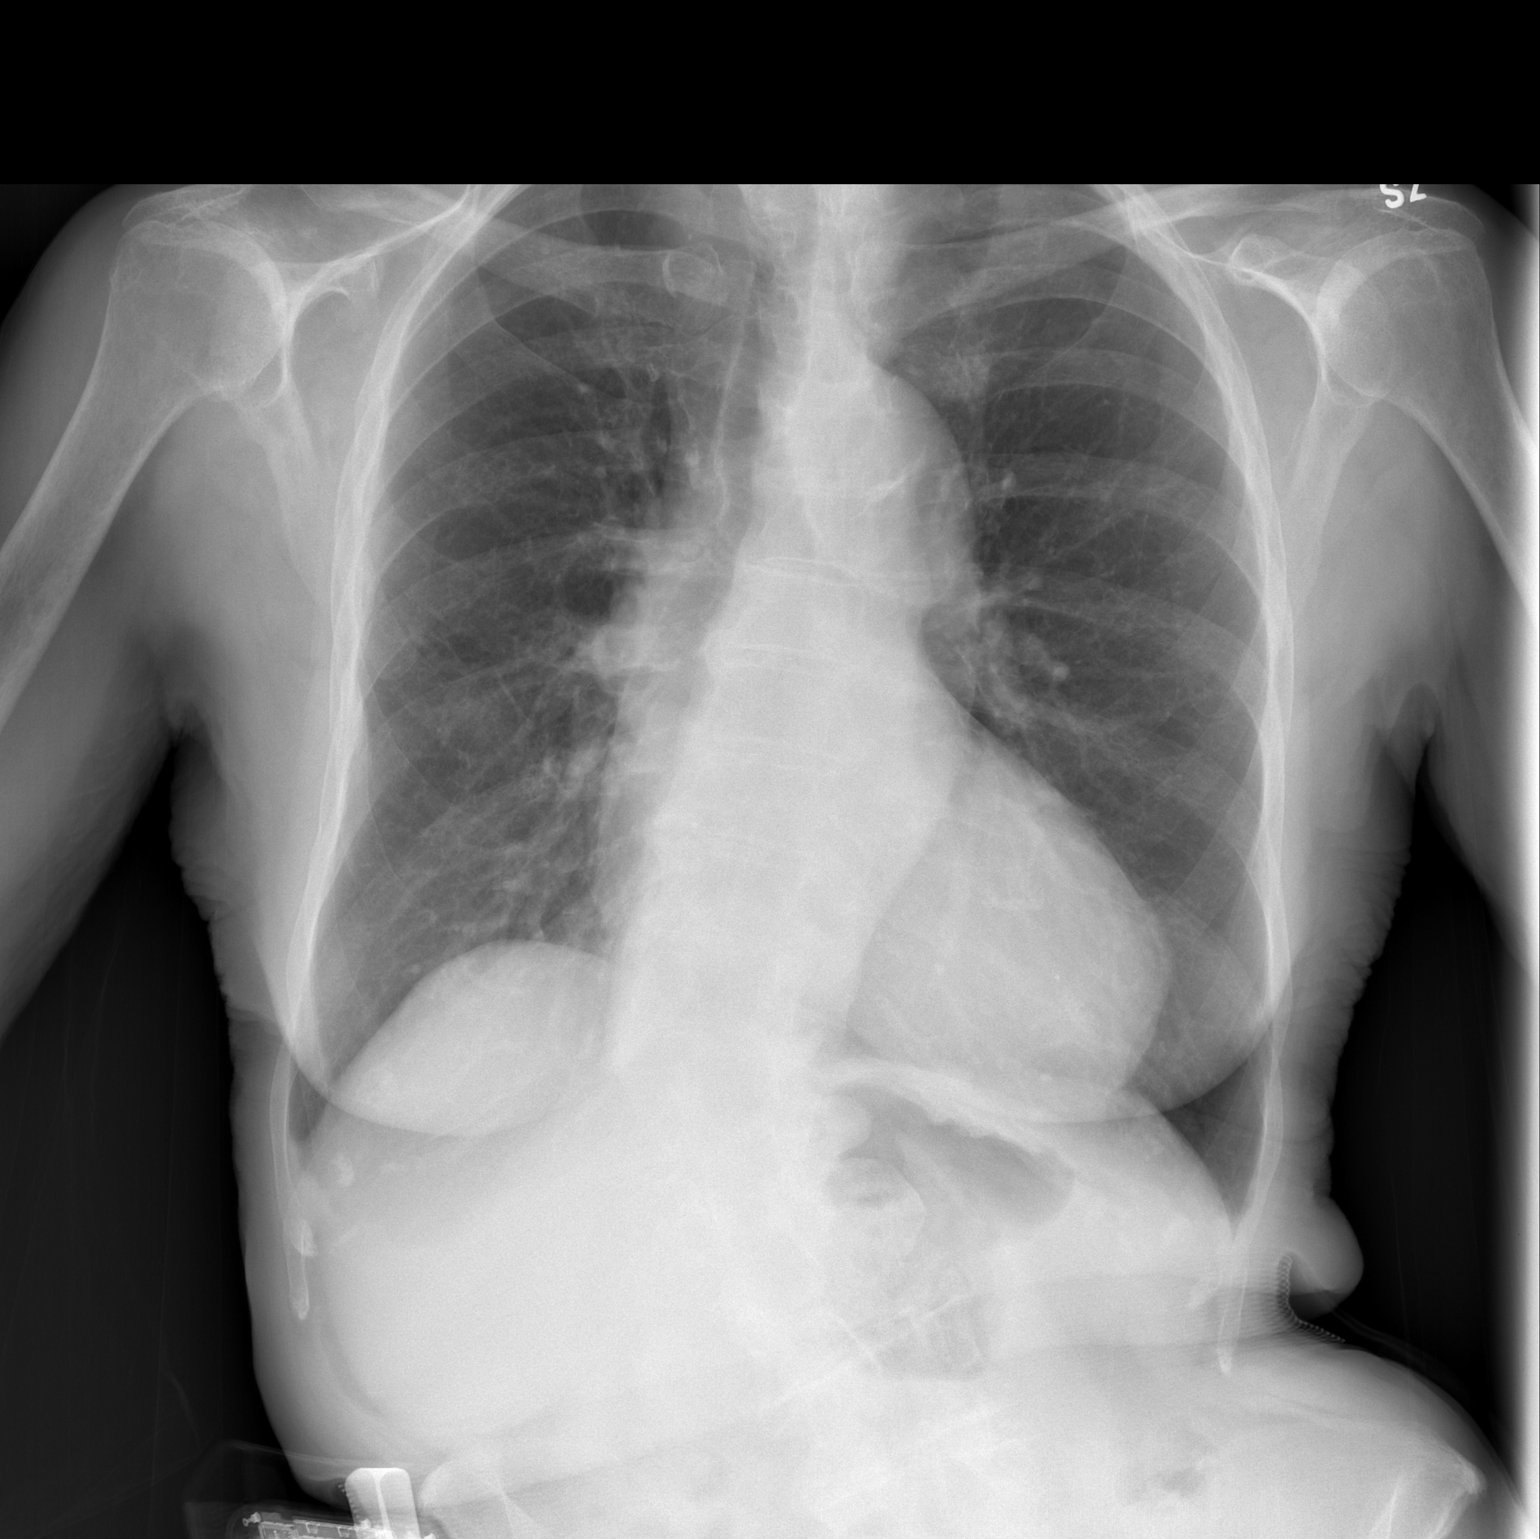

[w chest lat]
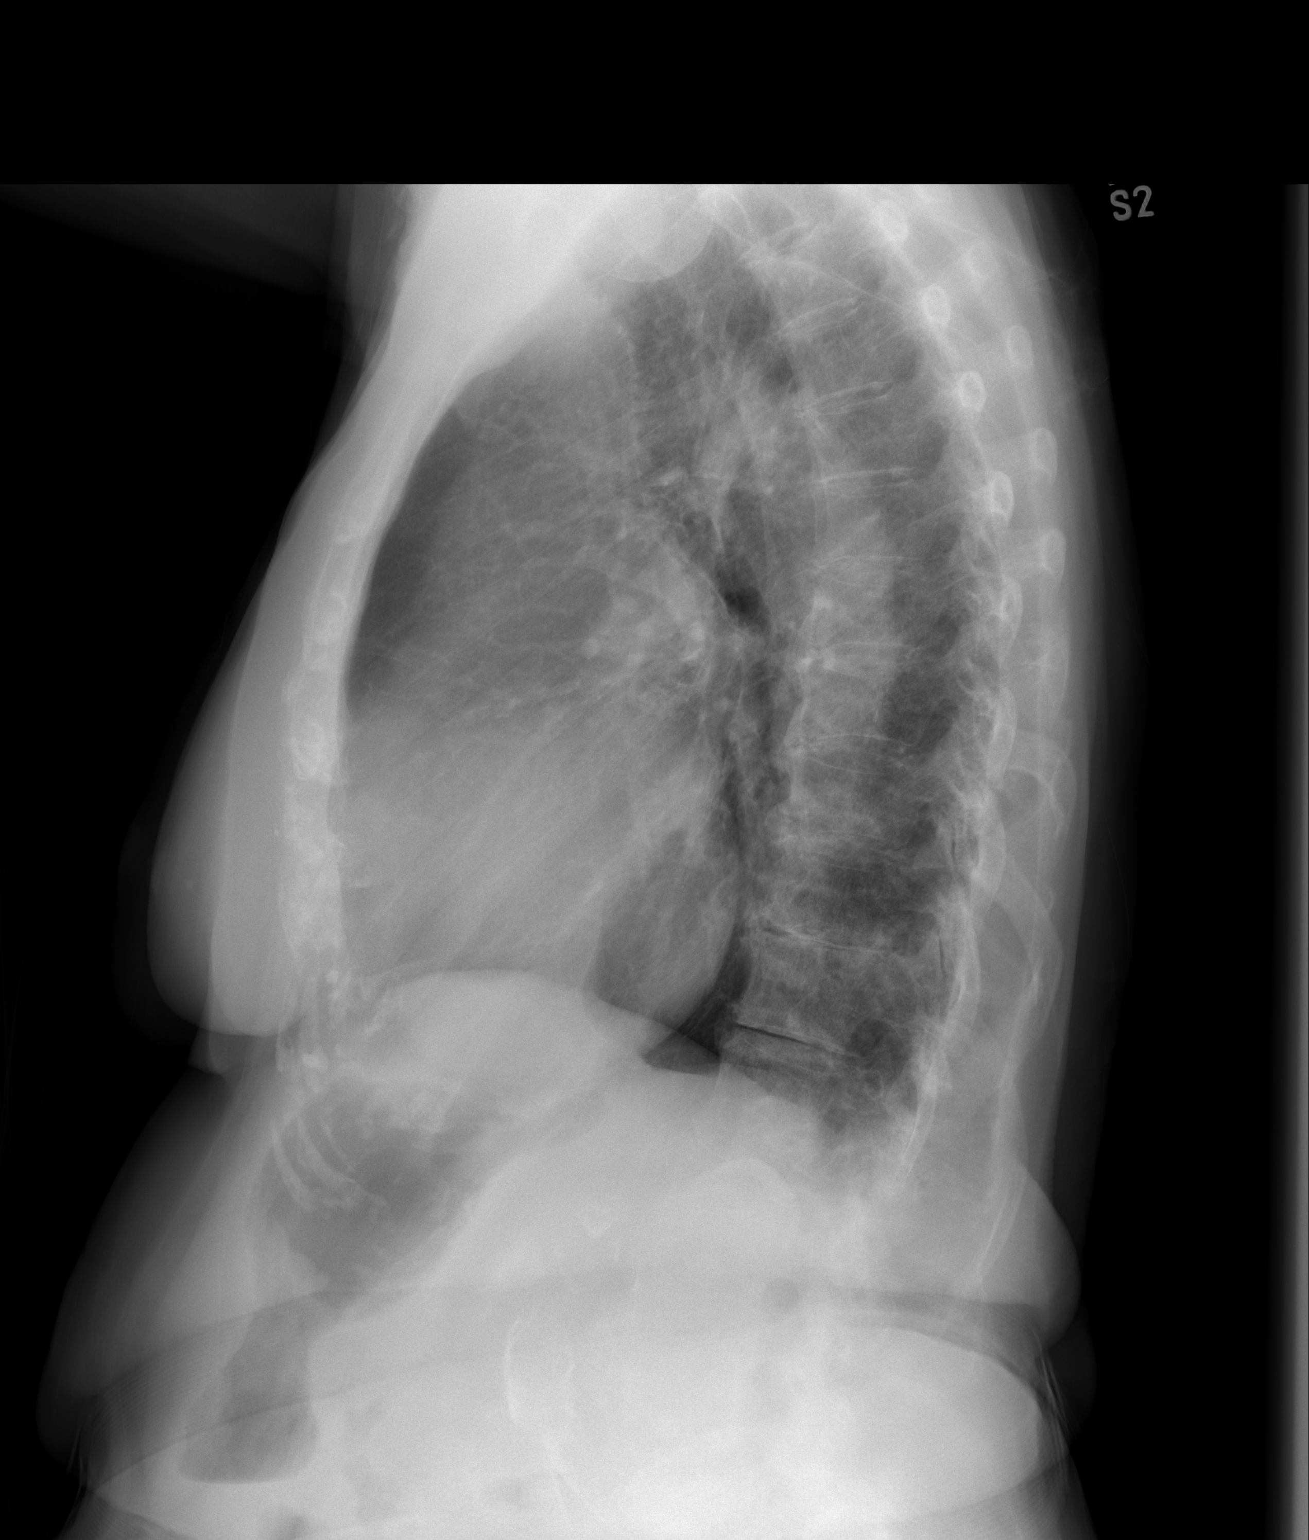

[2 of 2 positions shown; findings below may reference images not displayed]

FINDINGS: Mediastinum and hilar structures normal. Lungs are clear.
Cardiomegaly. No pulmonary venous congestion. No pleural effusion or
pneumothorax. No acute osseus abnormality. Thoracolumbar spine
scoliosis. Degenerative changes both shoulders.
IMPRESSION: 1. Cardiomegaly, no CHF.
2. No acute pulmonary disease.

## 2016-05-28 DEATH — deceased
